# Patient Record
Sex: Female | Born: 1975 | Race: Black or African American | Hispanic: No | Marital: Married | Smoking: Never smoker
Health system: Southern US, Community
[De-identification: ages and names within clinical notes are randomized; demographics above are authoritative.]

## PROBLEM LIST (undated history)

## (undated) ENCOUNTER — Inpatient Hospital Stay (HOSPITAL_COMMUNITY): Payer: Self-pay

## (undated) DIAGNOSIS — Z789 Other specified health status: Secondary | ICD-10-CM

## (undated) DIAGNOSIS — M25539 Pain in unspecified wrist: Secondary | ICD-10-CM

## (undated) HISTORY — PX: SHOULDER ARTHROSCOPY: SHX128

## (undated) HISTORY — PX: WISDOM TOOTH EXTRACTION: SHX21

## (undated) HISTORY — PX: WRIST SURGERY: SHX841

## (undated) HISTORY — PX: KNEE ARTHROSCOPY: SHX127

---

## 1997-07-21 ENCOUNTER — Other Ambulatory Visit: Admission: RE | Admit: 1997-07-21 | Discharge: 1997-07-21 | Payer: Self-pay | Admitting: Obstetrics and Gynecology

## 1998-06-30 ENCOUNTER — Other Ambulatory Visit: Admission: RE | Admit: 1998-06-30 | Discharge: 1998-06-30 | Payer: Self-pay | Admitting: Obstetrics and Gynecology

## 1998-09-30 ENCOUNTER — Other Ambulatory Visit: Admission: RE | Admit: 1998-09-30 | Discharge: 1998-09-30 | Payer: Self-pay | Admitting: Obstetrics and Gynecology

## 1998-11-29 ENCOUNTER — Ambulatory Visit (HOSPITAL_COMMUNITY): Admission: RE | Admit: 1998-11-29 | Discharge: 1998-11-29 | Payer: Self-pay | Admitting: Family Medicine

## 1998-11-29 ENCOUNTER — Encounter: Payer: Self-pay | Admitting: Family Medicine

## 1998-12-31 ENCOUNTER — Other Ambulatory Visit: Admission: RE | Admit: 1998-12-31 | Discharge: 1998-12-31 | Payer: Self-pay | Admitting: Obstetrics and Gynecology

## 1999-03-24 ENCOUNTER — Other Ambulatory Visit: Admission: RE | Admit: 1999-03-24 | Discharge: 1999-03-24 | Payer: Self-pay | Admitting: Obstetrics and Gynecology

## 1999-07-23 ENCOUNTER — Emergency Department (HOSPITAL_COMMUNITY): Admission: EM | Admit: 1999-07-23 | Discharge: 1999-07-23 | Payer: Self-pay | Admitting: Emergency Medicine

## 1999-09-26 ENCOUNTER — Other Ambulatory Visit: Admission: RE | Admit: 1999-09-26 | Discharge: 1999-09-26 | Payer: Self-pay | Admitting: Obstetrics and Gynecology

## 2001-04-12 ENCOUNTER — Other Ambulatory Visit: Admission: RE | Admit: 2001-04-12 | Discharge: 2001-04-12 | Payer: Self-pay | Admitting: Gynecology

## 2002-05-02 ENCOUNTER — Other Ambulatory Visit: Admission: RE | Admit: 2002-05-02 | Discharge: 2002-05-02 | Payer: Self-pay | Admitting: Gynecology

## 2003-01-28 ENCOUNTER — Encounter: Payer: Self-pay | Admitting: Family Medicine

## 2003-01-28 ENCOUNTER — Encounter: Admission: RE | Admit: 2003-01-28 | Discharge: 2003-01-28 | Payer: Self-pay | Admitting: Family Medicine

## 2004-07-18 ENCOUNTER — Other Ambulatory Visit: Admission: RE | Admit: 2004-07-18 | Discharge: 2004-07-18 | Payer: Self-pay | Admitting: Family Medicine

## 2004-11-15 ENCOUNTER — Ambulatory Visit (HOSPITAL_BASED_OUTPATIENT_CLINIC_OR_DEPARTMENT_OTHER): Admission: RE | Admit: 2004-11-15 | Discharge: 2004-11-15 | Payer: Self-pay | Admitting: Orthopaedic Surgery

## 2004-11-15 ENCOUNTER — Ambulatory Visit (HOSPITAL_COMMUNITY): Admission: RE | Admit: 2004-11-15 | Discharge: 2004-11-15 | Payer: Self-pay | Admitting: Orthopaedic Surgery

## 2007-10-29 ENCOUNTER — Emergency Department (HOSPITAL_COMMUNITY): Admission: EM | Admit: 2007-10-29 | Discharge: 2007-10-29 | Payer: Self-pay | Admitting: Emergency Medicine

## 2007-11-12 ENCOUNTER — Emergency Department (HOSPITAL_COMMUNITY): Admission: EM | Admit: 2007-11-12 | Discharge: 2007-11-12 | Payer: Self-pay | Admitting: Family Medicine

## 2008-01-03 ENCOUNTER — Ambulatory Visit: Payer: Self-pay | Admitting: Internal Medicine

## 2008-01-20 ENCOUNTER — Emergency Department (HOSPITAL_COMMUNITY): Admission: EM | Admit: 2008-01-20 | Discharge: 2008-01-20 | Payer: Self-pay | Admitting: Family Medicine

## 2008-02-10 ENCOUNTER — Ambulatory Visit: Payer: Self-pay | Admitting: *Deleted

## 2008-03-23 ENCOUNTER — Ambulatory Visit: Payer: Self-pay | Admitting: Family Medicine

## 2008-03-23 ENCOUNTER — Encounter: Payer: Self-pay | Admitting: Family Medicine

## 2008-03-23 LAB — CONVERTED CEMR LAB
ALT: 9 units/L (ref 0–35)
AST: 12 units/L (ref 0–37)
Albumin: 4.2 g/dL (ref 3.5–5.2)
Alkaline Phosphatase: 32 units/L — ABNORMAL LOW (ref 39–117)
BUN: 9 mg/dL (ref 6–23)
Basophils Absolute: 0 10*3/uL (ref 0.0–0.1)
Basophils Relative: 0 % (ref 0–1)
CO2: 21 meq/L (ref 19–32)
Calcium: 8.7 mg/dL (ref 8.4–10.5)
Chloride: 103 meq/L (ref 96–112)
Cholesterol: 138 mg/dL (ref 0–200)
Creatinine, Ser: 0.71 mg/dL (ref 0.40–1.20)
Eosinophils Absolute: 0.3 10*3/uL (ref 0.0–0.7)
Eosinophils Relative: 4 % (ref 0–5)
Glucose, Bld: 93 mg/dL (ref 70–99)
HCT: 40.2 % (ref 36.0–46.0)
HDL: 58 mg/dL (ref >39)
Hemoglobin: 13 g/dL (ref 12.0–15.0)
Hgb A2 Quant: 2.4 % (ref 2.2–3.2)
Hgb A: 97.6 % (ref 96.8–97.8)
Hgb F Quant: 0 % (ref 0.0–2.0)
Hgb S Quant: 0 % (ref 0.0–0.0)
LDL Cholesterol: 57 mg/dL (ref 0–99)
Lymphocytes Relative: 46 % (ref 12–46)
Lymphs Abs: 3.5 10*3/uL (ref 0.7–4.0)
MCHC: 32.3 g/dL (ref 30.0–36.0)
MCV: 90.3 fL (ref 78.0–100.0)
Monocytes Absolute: 0.4 10*3/uL (ref 0.1–1.0)
Monocytes Relative: 5 % (ref 3–12)
Neutro Abs: 3.4 10*3/uL (ref 1.7–7.7)
Neutrophils Relative %: 45 % (ref 43–77)
Platelets: 275 10*3/uL (ref 150–400)
Potassium: 4 meq/L (ref 3.5–5.3)
RBC: 4.45 M/uL (ref 3.87–5.11)
RDW: 15.3 % (ref 11.5–15.5)
Sed Rate: 6 mm/hr (ref 0–22)
Sodium: 138 meq/L (ref 135–145)
TSH: 2.035 microintl units/mL (ref 0.350–4.50)
Total Bilirubin: 0.3 mg/dL (ref 0.3–1.2)
Total CHOL/HDL Ratio: 2.4
Total Protein: 6.8 g/dL (ref 6.0–8.3)
Triglycerides: 116 mg/dL (ref <150)
VLDL: 23 mg/dL (ref 0–40)
WBC: 7.5 10*3/uL (ref 4.0–10.5)

## 2008-03-30 ENCOUNTER — Ambulatory Visit: Payer: Self-pay | Admitting: Family Medicine

## 2008-05-15 ENCOUNTER — Encounter: Payer: Self-pay | Admitting: Family Medicine

## 2008-05-15 ENCOUNTER — Ambulatory Visit: Payer: Self-pay | Admitting: Internal Medicine

## 2008-05-15 LAB — CONVERTED CEMR LAB
Chlamydia, DNA Probe: NEGATIVE
GC Probe Amp, Genital: NEGATIVE

## 2010-05-31 ENCOUNTER — Other Ambulatory Visit: Payer: Self-pay | Admitting: Obstetrics and Gynecology
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
## 2011-10-01 ENCOUNTER — Inpatient Hospital Stay (HOSPITAL_COMMUNITY)
Admission: AD | Admit: 2011-10-01 | Discharge: 2011-10-01 | Disposition: A | Discharge status: Home / Self Care | Payer: Managed Care, Other (non HMO) | Source: Ambulatory Visit | Attending: Obstetrics & Gynecology | Admitting: Obstetrics & Gynecology

## 2011-10-01 ENCOUNTER — Encounter (HOSPITAL_COMMUNITY): Payer: Self-pay | Admitting: *Deleted

## 2011-10-01 ENCOUNTER — Inpatient Hospital Stay (HOSPITAL_COMMUNITY): Payer: Managed Care, Other (non HMO)

## 2011-10-01 DIAGNOSIS — R109 Unspecified abdominal pain: Secondary | ICD-10-CM | POA: Insufficient documentation

## 2011-10-01 DIAGNOSIS — O209 Hemorrhage in early pregnancy, unspecified: Secondary | ICD-10-CM | POA: Insufficient documentation

## 2011-10-01 HISTORY — DX: Other specified health status: Z78.9

## 2011-10-01 LAB — ABO/RH: ABO/RH(D): O POS

## 2011-10-01 MED ORDER — HYDROCODONE-ACETAMINOPHEN 5-500 MG PO TABS: 1.0000 | ORAL_TABLET | Freq: Four times a day (QID) | ORAL | Status: AC | PRN

## 2011-10-01 NOTE — ED Provider Notes (Signed)
History  Melinda Christian 36 y.o. G1P0  7+ wks   RP:  7+ wks with heavy vaginal bleeding and cramping.     Chief Complaint  Patient presents with  . Vaginal Bleeding    OB History    Grav Para Term Preterm Abortions TAB SAB Ect Mult Living   1               Past Medical History  Diagnosis Date  . No pertinent past medical history     Past Surgical History  Procedure Date  . Wisdom tooth extraction   . Knee arthroscopy   . Shoulder arthroscopy     Family History  Problem Relation Age of Onset  . Other Neg Hx     History  Substance Use Topics  . Smoking status: Never Smoker   . Smokeless tobacco: Never Used  . Alcohol Use: No    Allergies: No Known Allergies  Prescriptions prior to admission  Medication Sig Dispense Refill  . acetaminophen (TYLENOL) 500 MG tablet Take 1,000 mg by mouth every 4 (four) hours as needed. For pain      . Prenatal Vit-Fe Fumarate-FA (PRENATAL MULTIVITAMIN) TABS Take 1 tablet by mouth daily.        Physical Exam   Blood pressure 124/88, pulse 94, temperature 98.9 F (37.2 C), temperature source Oral, resp. rate 20, height 5\' 11"  (1.803 m), weight 68.856 kg (151 lb 12.8 oz), last menstrual period 08/09/2011.  Abdo soft NT Speculum dark small blood clot with cervix opened to 1 cm.  No POC identified. VE Ante uterus, non-tender.  No adnexal mass.  [place] 7+wks sIUP EP with no FHR, GS in LUS, large SCH.  ED Course  Mild vaginal dark bleeding Stable vital signs  A/P  7+ wks with vaginal bleeding, MAB per [place] with EP but no FHR and GS in lower uterine segment with large [facility].         Decision to observe for Spontaneous Ab.  Precautions associated with heavy bleeding discussed.  Will f/u with Dr Juliene Pina tomorrow am at [facility].  O+.  Genia Del MD  10/01/2011 at 3:08 pm

## 2011-10-01 NOTE — Discharge Instructions (Signed)
Incomplete Miscarriage  Miscarriages in pregnancy are common. A miscarriage is a pregnancy that has ended before the twentieth week. You have had an incomplete miscarriage. Partial parts of the fetus or placenta (afterbirth) remain behind. Sometimes further treatment is needed. The most common reason for further treatment is continued bleeding (hemorrhage). Tissue left behind may also become infected. Treatment usually is curettage. Curettage for an incomplete abortion is a procedure in which the remaining products of pregnancy are removed. This can be done by a simple sucking procedure (suction curettage). It can also be done by a simple scraping (curettage) of the inside of the uterus (womb). This may be done in the hospital or in the caregiver's office. This is only done when your caregiver knows the pregnancy has ended. This is determined by physical examination and a negative pregnancy test. It may also include an ultrasound to confirm a dead fetus. The ultrasound may also prove that products of the pregnancy remain in the uterus.  If your cervix remains dilated and you are still passing clots and tissue, your caregiver may wish to watch you for a little while. Your caregiver may want to see if you are going to finish passing all of the remaining parts of the pregnancy. If the bleeding continues, they may proceed with curettage.  WHY DO I FEEL THIS WAY  Miscarriages can be a very emotional time for prospective mothers. This is not you or your partner's fault. The miscarriage did not occur because of a lack in you or your partner. Nearly all miscarriages occur because the pregnancy has started off wrongly. At least half of miscarried pregnancies have a chromosomal abnormality (almost always not inherited). Others may have developmental problems with the fetus or placentas. Problems may not show up even when the products miscarried are studied under the microscope. You can usually begin trying for another  pregnancy as soon as your caregiver says it's okay.  HOME CARE INSTRUCTIONS    Your caregiver may order bed rest (this means only getting up to use the bathroom). Your caregiver may allow you to continue light activity. If curettage was not done at this time, but you require further treatment.   Keep track of the number of pads you use each day. Keep track of how saturated (soaked) they are. Record this information.   Do not use tampons. Do not douche or have sexual intercourse until approved by your caregiver.   It is very important to keep all follow-up appointments for re-evaluation and continuing management.   Women who have an Rh negative blood type (ie, A, B, AB, or O negative) need to receive a drug called Rh(D) immune globulin. This medicine helps protect future fetuses against problems that can occur if an Rh negative mother is carrying a baby who is Rh positive.  SEEK IMMEDIATE MEDICAL CARE IF:    You experience severe cramps in your stomach, back, or abdomen.   You run an unexplained temperature (record these).   You pass large clots or tissue (save any tissue for your caregiver to inspect).   Your bleeding increases or you become light-headed, weak, or have fainting episodes.  MAKE SURE YOU:    Understand these instructions.   Will watch your condition.   Will get help right away if you are not doing well or get worse.  Document Released: 03/27/2005 Document Revised: 03/16/2011 Document Reviewed: 11/15/2007  [employer] Patient Information 2012 [employer].

## 2011-10-01 NOTE — MAU Provider Note (Signed)
Medical Screening exam and patient care preformed by advanced practice provider.  Agree with the above management.  

## 2011-10-01 NOTE — MAU Provider Note (Signed)
36yo AAF presents with lower abd cramping and bleeding. No active bleeding at present.  Filed Vitals:   10/01/11 1212  BP: 124/88  Pulse: 94  Temp: 98.9 F (37.2 C)  Resp: 20   US reveals likely missed abortion at [redacted]w[redacted]d. Dr. Seymour Bars aware and on her way to see pt in MAU. Pt stable to await Dr. Seymour Bars arrival to complete evaluation.   Devereaux Grayson E. 2:37 PM

## 2011-10-01 NOTE — MAU Note (Signed)
Pt reports having heavy bleeding and reports passing a large mass this morning. Having pain and cramping ans well.

## 2011-10-01 NOTE — MAU Note (Signed)
Dr. Seymour Bars notified pt in MAU with c/o bleeding/passing clots, was seen in MD office Thursday. Orders for blood type and u/s. Will call MD with results.

## 2011-10-01 NOTE — MAU Note (Signed)
Pt states spotting since last Sunday, bleeding became worse Thursday, passing quarter sized clots per pt. Cramping in lower abdomen.

## 2011-11-01 ENCOUNTER — Encounter (HOSPITAL_COMMUNITY): Payer: Self-pay | Admitting: Emergency Medicine

## 2011-11-01 ENCOUNTER — Emergency Department (HOSPITAL_COMMUNITY)
Admission: EM | Admit: 2011-11-01 | Discharge: 2011-11-01 | Disposition: A | Discharge status: Home / Self Care | Payer: Managed Care, Other (non HMO) | Source: Home / Self Care | Attending: Family Medicine | Admitting: Family Medicine

## 2011-11-01 DIAGNOSIS — J069 Acute upper respiratory infection, unspecified: Secondary | ICD-10-CM

## 2011-11-01 LAB — POCT RAPID STREP A: Streptococcus, Group A Screen (Direct): NEGATIVE

## 2011-11-01 MED ORDER — PREDNISONE 20 MG PO TABS: ORAL_TABLET | ORAL | Status: AC

## 2011-11-01 MED ORDER — HYDROCODONE-ACETAMINOPHEN 7.5-325 MG/15ML PO SOLN: 15.0000 mL | Freq: Three times a day (TID) | ORAL | Status: AC | PRN

## 2011-11-01 MED ORDER — AZITHROMYCIN 250 MG PO TABS: 250.0000 mg | ORAL_TABLET | Freq: Every day | ORAL | Status: AC

## 2011-11-01 MED ORDER — FEXOFENADINE-PSEUDOEPHED ER 60-120 MG PO TB12: 1.0000 | ORAL_TABLET | Freq: Two times a day (BID) | ORAL | Status: DC

## 2011-11-01 NOTE — ED Notes (Signed)
Pt here with sinus sx that started x 4 dys ago.sx facial pressure,dry cough with chest congestion unrelieved by otc Delsym and Nyquil.denies fever,chills,n,v

## 2011-11-02 NOTE — ED Provider Notes (Signed)
History     CSN: [id number]  Arrival date & time 11/01/11  1101   First MD Initiated Contact with Patient 11/01/11 1107      Chief Complaint  Patient presents with  . Sinusitis    (Consider location/radiation/quality/duration/timing/severity/associated sxs/prior treatment) HPI Comments: 36 year old nonsmoker female with history of recurrent sinusitis and allergic rhinitis. Here complaining of nasal congestion and sinus pressure associated with nonproductive cough and sore throat for 4 days. Denies fever or chills. Appetite decreased. No nausea vomiting or diarrhea. Denies chest pain, difficulty breathing or wheezing.   Past Medical History  Diagnosis Date  . No pertinent past medical history     Past Surgical History  Procedure Date  . Wisdom tooth extraction   . Knee arthroscopy   . Shoulder arthroscopy     Family History  Problem Relation Age of Onset  . Other Neg Hx     History  Substance Use Topics  . Smoking status: Never Smoker   . Smokeless tobacco: Never Used  . Alcohol Use: Yes    OB History    Grav Para Term Preterm Abortions TAB SAB Ect Mult Living   1               Review of Systems  Constitutional: Negative for fever, chills and diaphoresis.  HENT: Positive for congestion, sore throat, rhinorrhea and sinus pressure. Negative for trouble swallowing.   Eyes: Negative for discharge and visual disturbance.  Respiratory: Positive for cough. Negative for chest tightness, shortness of breath and wheezing.   Cardiovascular: Negative for chest pain, palpitations and leg swelling.  Gastrointestinal: Negative for nausea, vomiting, abdominal pain and diarrhea.  Musculoskeletal: Negative for myalgias and arthralgias.  Skin: Negative for rash.  Neurological: Positive for headaches.    Allergies  Review of patient's allergies indicates no known allergies.  Home Medications   Current Outpatient Rx  Name Route Sig Dispense Refill  . ACETAMINOPHEN 500 MG  PO TABS Oral Take 1,000 mg by mouth every 4 (four) hours as needed. For pain    . AZITHROMYCIN 250 MG PO TABS Oral Take 1 tablet (250 mg total) by mouth daily. Take first 2 tablets together, then 1 every day until finished. Hold start taking if persistent symptoms after 48 hours. 6 tablet 0  . FEXOFENADINE-PSEUDOEPHED ER 60-120 MG PO TB12 Oral Take 1 tablet by mouth every 12 (twelve) hours. 30 tablet 0  . HYDROCODONE-ACETAMINOPHEN 7.5-325 MG/15ML PO SOLN Oral Take 15 mLs by mouth every 8 (eight) hours as needed for pain (take as instructed  for cough or pain). 120 mL 0  . PREDNISONE 20 MG PO TABS  2 tabs po daily for 3 days 6 tablet 0  . PRENATAL MULTIVITAMIN CH Oral Take 1 tablet by mouth daily.      BP 127/85  Pulse 103  Temp 98.7 F (37.1 C) (Oral)  Resp 16  SpO2 100%  LMP 08/09/2011  Breastfeeding? Unknown  Physical Exam  Nursing note and vitals reviewed. Constitutional: She is oriented to person, place, and time. She appears well-developed and well-nourished. No distress.  HENT:  Head: Normocephalic and atraumatic.  Right Ear: External ear normal.  Left Ear: External ear normal.       Nasal Congestion with erythema and swelling of nasal turbinates, clear rhinorrhea. Reported bilateral maxillary sinus tenderness. pharyngeal erythema no exudates. No uvula deviation. No trismus. TM's normal  Eyes: Conjunctivae are normal. Pupils are equal, round, and reactive to light. Right eye exhibits no discharge. Left  eye exhibits no discharge. No scleral icterus.  Neck: Normal range of motion. Neck supple. No JVD present.  Cardiovascular: Normal rate, regular rhythm and normal heart sounds.   Pulmonary/Chest: Breath sounds normal. No respiratory distress. She has no wheezes. She has no rales. She exhibits no tenderness.       Persistent dry cough during examination  Abdominal: Soft. She exhibits no distension. There is no tenderness.  Lymphadenopathy:    She has no cervical adenopathy.    Neurological: She is alert and oriented to person, place, and time.  Skin: No rash noted.    ED Course  Procedures (including critical care time)   Labs Reviewed  POCT RAPID STREP A ([facility] CARE ONLY)  LAB REPORT - SCANNED   No results found.   1. URI (upper respiratory infection)       MDM  Negative rapid strep test. Impress viral upper respiratory infection but given history of recurrent sinus infection and late onset of sore throat gave a hold prescription for azithromycin patient will fill if persistent or worsening symptoms after 48 hours. Prescribe prednisone for 3 days and treated symptomatically with fexofenadine/pseudoephedrine and hydrocodone/acetaminophen. Supportive care discussed with patient and also provided in writing. Asked to return if worsening symptoms despite following treatment.        Sharin Grave, MD 11/02/11 1034

## 2011-11-29 LAB — OB RESULTS CONSOLE ANTIBODY SCREEN: Antibody Screen: NEGATIVE

## 2011-11-29 LAB — OB RESULTS CONSOLE HIV ANTIBODY (ROUTINE TESTING): HIV: NONREACTIVE

## 2012-03-01 LAB — OB RESULTS CONSOLE GC/CHLAMYDIA
Chlamydia: NEGATIVE
Gonorrhea: NEGATIVE

## 2012-04-10 NOTE — L&D Delivery Note (Signed)
Delivery Note At 10:13 AM a viable and healthy female was delivered via Vaginal, Spontaneous Delivery (Presentation: Right Occiput Anterior).  APGAR: 9, 9; weight .  7lb 2 oz Placenta status: Intact, Spontaneous.  Cord:  CAN x 1 reducible. 3 vessels with the following complications: None.  Cord pH: none  Anesthesia: Epidural  Episiotomy: None Lacerations: 3rd degree;Perineal, vaginal sulcus Suture Repair: 3.0 chromic vicryl O- vicryl x 4, <2 cm length for repair of third degree Est. Blood Loss (mL): 350  Mom to postpartum.  Baby to nursery-stable.  Stpehanie Montroy A 08/30/2012, 10:55 AM

## 2012-07-22 LAB — OB RESULTS CONSOLE GBS: GBS: POSITIVE

## 2012-08-29 ENCOUNTER — Inpatient Hospital Stay (HOSPITAL_COMMUNITY)
Admission: AD | Admit: 2012-08-29 | Discharge: 2012-09-01 | DRG: 775 | Disposition: A | Discharge status: Home / Self Care | Payer: Managed Care, Other (non HMO) | Source: Ambulatory Visit | Attending: Obstetrics and Gynecology | Admitting: Obstetrics and Gynecology

## 2012-08-29 ENCOUNTER — Encounter (HOSPITAL_COMMUNITY): Payer: Self-pay

## 2012-08-29 DIAGNOSIS — D62 Acute posthemorrhagic anemia: Secondary | ICD-10-CM | POA: Diagnosis not present

## 2012-08-29 DIAGNOSIS — O09529 Supervision of elderly multigravida, unspecified trimester: Secondary | ICD-10-CM | POA: Diagnosis present

## 2012-08-29 DIAGNOSIS — O99892 Other specified diseases and conditions complicating childbirth: Secondary | ICD-10-CM | POA: Diagnosis present

## 2012-08-29 DIAGNOSIS — Z22330 Carrier of Group B streptococcus: Secondary | ICD-10-CM

## 2012-08-29 DIAGNOSIS — O9903 Anemia complicating the puerperium: Secondary | ICD-10-CM | POA: Diagnosis not present

## 2012-08-29 LAB — CBC
HCT: 36.9 % (ref 36.0–46.0)
MCHC: 33.6 g/dL (ref 30.0–36.0)
MCV: 90.9 fL (ref 78.0–100.0)
Platelets: 174 10*3/uL (ref 150–400)
RDW: 15.2 % (ref 11.5–15.5)
WBC: 9.6 10*3/uL (ref 4.0–10.5)

## 2012-08-29 MED ORDER — DEXTROSE 5 % IV SOLN
2.5000 10*6.[IU] | INTRAVENOUS | Status: DC
  Administered 2012-08-30 (×2): 2.5 10*6.[IU] via INTRAVENOUS
  Filled 2012-08-29 (×5): qty 2.5

## 2012-08-29 MED ORDER — ONDANSETRON HCL 4 MG/2ML IJ SOLN: 4.0000 mg | Freq: Four times a day (QID) | INTRAMUSCULAR | Status: DC | PRN

## 2012-08-29 MED ORDER — OXYTOCIN 40 UNITS IN LACTATED RINGERS INFUSION - SIMPLE MED
62.5000 mL/h | INTRAVENOUS | Status: DC
  Administered 2012-08-30: 62.5 mL/h via INTRAVENOUS

## 2012-08-29 MED ORDER — OXYTOCIN 40 UNITS IN LACTATED RINGERS INFUSION - SIMPLE MED
1.0000 m[IU]/min | INTRAVENOUS | Status: DC
  Administered 2012-08-29: 1 m[IU]/min via INTRAVENOUS
  Administered 2012-08-30: 12 m[IU]/min via INTRAVENOUS
  Filled 2012-08-29: qty 1000

## 2012-08-29 MED ORDER — PENICILLIN G POTASSIUM 5000000 UNITS IJ SOLR
5.0000 10*6.[IU] | Freq: Once | INTRAVENOUS | Status: AC
  Administered 2012-08-29: 5 10*6.[IU] via INTRAVENOUS
  Filled 2012-08-29: qty 5

## 2012-08-29 MED ORDER — NALBUPHINE HCL 10 MG/ML IJ SOLN
10.0000 mg | INTRAMUSCULAR | Status: DC | PRN
  Administered 2012-08-30: 10 mg via INTRAVENOUS
  Filled 2012-08-29 (×2): qty 1

## 2012-08-29 MED ORDER — LACTATED RINGERS IV SOLN: 500.0000 mL | INTRAVENOUS | Status: DC | PRN

## 2012-08-29 MED ORDER — LIDOCAINE HCL (PF) 1 % IJ SOLN
30.0000 mL | INTRAMUSCULAR | Status: DC | PRN
  Administered 2012-08-30: 30 mL via SUBCUTANEOUS
  Filled 2012-08-29 (×2): qty 30

## 2012-08-29 MED ORDER — ACETAMINOPHEN 325 MG PO TABS: 650.0000 mg | ORAL_TABLET | ORAL | Status: DC | PRN

## 2012-08-29 MED ORDER — OXYTOCIN BOLUS FROM INFUSION: 500.0000 mL | INTRAVENOUS | Status: DC

## 2012-08-29 MED ORDER — TERBUTALINE SULFATE 1 MG/ML IJ SOLN: 0.2500 mg | Freq: Once | INTRAMUSCULAR | Status: AC | PRN

## 2012-08-29 MED ORDER — OXYTOCIN 10 UNIT/ML IJ SOLN: 10.0000 [IU] | Freq: Once | INTRAMUSCULAR | Status: DC

## 2012-08-29 MED ORDER — CITRIC ACID-SODIUM CITRATE 334-500 MG/5ML PO SOLN: 30.0000 mL | ORAL | Status: DC | PRN

## 2012-08-29 MED ORDER — LACTATED RINGERS IV SOLN
INTRAVENOUS | Status: DC
  Administered 2012-08-30: 04:00:00 via INTRAVENOUS

## 2012-08-29 MED ORDER — OXYCODONE-ACETAMINOPHEN 5-325 MG PO TABS
1.0000 | ORAL_TABLET | ORAL | Status: DC | PRN
  Filled 2012-08-29: qty 2

## 2012-08-29 MED ORDER — IBUPROFEN 600 MG PO TABS
600.0000 mg | ORAL_TABLET | Freq: Four times a day (QID) | ORAL | Status: DC | PRN
  Administered 2012-08-30: 600 mg via ORAL
  Filled 2012-08-29: qty 1

## 2012-08-29 NOTE — H&P (Signed)
Melinda Christian is a 37 y.o. female presenting for admission due to SROM clear fluid @ 4:30PM (+) mild ctx. GBS cx (+) History OB History   Grav Para Term Preterm Abortions TAB SAB Ect Mult Living   2    1  1         Past Medical History  Diagnosis Date  . No pertinent past medical history    Past Surgical History  Procedure Laterality Date  . Wisdom tooth extraction    . Knee arthroscopy    . Shoulder arthroscopy     Family History: family history is negative for Other. Social History:  reports that she has never smoked. She has never used smokeless tobacco. She reports that  drinks alcohol. She reports that she does not use illicit drugs.   Prenatal Transfer Tool  Maternal Diabetes: No Genetic Screening: Normal Maternal Ultrasounds/Referrals: Normal Fetal Ultrasounds or other Referrals:  None Maternal Substance Abuse:  No Significant Maternal Medications:  None Significant Maternal Lab Results:  Lab values include: Group B Strep positive Other Comments:  None  ROS neg Dilation: 1.5 Effacement (%): 60 Station: -2 Exam by:: Dr. Cherly Hensen Blood pressure 136/81, pulse 83, temperature 98.1 F (36.7 C), temperature source Oral, resp. rate 16, last menstrual period 08/09/2011, unknown if currently breastfeeding. Maternal Exam:  Uterine Assessment: Contraction strength is mild.  Abdomen: Patient reports no abdominal tenderness. Estimated fetal weight is 7lb.   Fetal presentation: vertex  Introitus: Normal vulva. Ferning test: positive.  Amniotic fluid character: clear.  Pelvis: adequate for delivery.   Cervix: Cervix evaluated by digital exam.     Physical Exam  Constitutional: She is oriented to person, place, and time. She appears well-developed and well-nourished.  Neck: Neck supple.  Cardiovascular: Regular rhythm.   Respiratory: Breath sounds normal.  GI: Soft.  Neurological: She is alert and oriented to person, place, and time.  Skin: Skin is warm and dry.   Psychiatric: She has a normal mood and affect.    Prenatal labs: ABO, Rh: --/--/O POS (06/23 1253) Antibody:  neg Rubella:  Immune RPR:   NR HBsAg:   neg HIV:   NR GBS:   positive  Assessment/Plan: SROM GBS cx (+) Term gestation P) admit routine labs. Pitocin. IV PCN analgesic prn   Melinda Christian A 08/29/2012, 7:33 PM

## 2012-08-30 ENCOUNTER — Inpatient Hospital Stay (HOSPITAL_COMMUNITY): Payer: Managed Care, Other (non HMO) | Admitting: Anesthesiology

## 2012-08-30 ENCOUNTER — Encounter (HOSPITAL_COMMUNITY): Payer: Self-pay | Admitting: *Deleted

## 2012-08-30 ENCOUNTER — Encounter (HOSPITAL_COMMUNITY): Payer: Self-pay | Admitting: Anesthesiology

## 2012-08-30 MED ORDER — PHENYLEPHRINE 40 MCG/ML (10ML) SYRINGE FOR IV PUSH (FOR BLOOD PRESSURE SUPPORT)
80.0000 ug | PREFILLED_SYRINGE | INTRAVENOUS | Status: DC | PRN
  Filled 2012-08-30: qty 2

## 2012-08-30 MED ORDER — DIBUCAINE 1 % RE OINT: 1.0000 "application " | TOPICAL_OINTMENT | RECTAL | Status: DC | PRN

## 2012-08-30 MED ORDER — DIPHENHYDRAMINE HCL 25 MG PO CAPS: 25.0000 mg | ORAL_CAPSULE | Freq: Four times a day (QID) | ORAL | Status: DC | PRN

## 2012-08-30 MED ORDER — LANOLIN HYDROUS EX OINT: TOPICAL_OINTMENT | CUTANEOUS | Status: DC | PRN

## 2012-08-30 MED ORDER — FERROUS SULFATE 325 (65 FE) MG PO TABS: 325.0000 mg | ORAL_TABLET | Freq: Two times a day (BID) | ORAL | Status: DC

## 2012-08-30 MED ORDER — LACTATED RINGERS IV SOLN
500.0000 mL | Freq: Once | INTRAVENOUS | Status: AC
  Administered 2012-08-30: 500 mL via INTRAVENOUS

## 2012-08-30 MED ORDER — EPHEDRINE 5 MG/ML INJ
10.0000 mg | INTRAVENOUS | Status: DC | PRN
  Filled 2012-08-30: qty 2

## 2012-08-30 MED ORDER — IBUPROFEN 600 MG PO TABS
600.0000 mg | ORAL_TABLET | Freq: Four times a day (QID) | ORAL | Status: DC
  Administered 2012-08-30 – 2012-08-31 (×4): 600 mg via ORAL
  Filled 2012-08-30 (×4): qty 1

## 2012-08-30 MED ORDER — WITCH HAZEL-GLYCERIN EX PADS
1.0000 "application " | MEDICATED_PAD | CUTANEOUS | Status: DC | PRN
  Administered 2012-08-31: 1 via TOPICAL

## 2012-08-30 MED ORDER — OXYCODONE-ACETAMINOPHEN 5-325 MG PO TABS
1.0000 | ORAL_TABLET | ORAL | Status: DC | PRN
  Administered 2012-08-30 – 2012-08-31 (×4): 2 via ORAL
  Administered 2012-08-31: 1 via ORAL
  Administered 2012-08-31 (×2): 2 via ORAL
  Administered 2012-08-31: 1 via ORAL
  Administered 2012-09-01 (×2): 2 via ORAL
  Filled 2012-08-30 (×4): qty 2
  Filled 2012-08-30: qty 1
  Filled 2012-08-30 (×3): qty 2
  Filled 2012-08-30: qty 1

## 2012-08-30 MED ORDER — PHENYLEPHRINE 40 MCG/ML (10ML) SYRINGE FOR IV PUSH (FOR BLOOD PRESSURE SUPPORT)
80.0000 ug | PREFILLED_SYRINGE | INTRAVENOUS | Status: DC | PRN
  Filled 2012-08-30: qty 2
  Filled 2012-08-30: qty 5

## 2012-08-30 MED ORDER — EPHEDRINE 5 MG/ML INJ
10.0000 mg | INTRAVENOUS | Status: DC | PRN
  Filled 2012-08-30: qty 4
  Filled 2012-08-30: qty 2

## 2012-08-30 MED ORDER — FENTANYL 2.5 MCG/ML BUPIVACAINE 1/10 % EPIDURAL INFUSION (WH - ANES)
14.0000 mL/h | INTRAMUSCULAR | Status: DC | PRN
  Administered 2012-08-30: 14 mL/h via EPIDURAL
  Filled 2012-08-30: qty 125

## 2012-08-30 MED ORDER — ZOLPIDEM TARTRATE 5 MG PO TABS: 5.0000 mg | ORAL_TABLET | Freq: Every evening | ORAL | Status: DC | PRN

## 2012-08-30 MED ORDER — DIPHENHYDRAMINE HCL 50 MG/ML IJ SOLN: 12.5000 mg | INTRAMUSCULAR | Status: DC | PRN

## 2012-08-30 MED ORDER — ONDANSETRON HCL 4 MG PO TABS: 4.0000 mg | ORAL_TABLET | ORAL | Status: DC | PRN

## 2012-08-30 MED ORDER — SIMETHICONE 80 MG PO CHEW: 80.0000 mg | CHEWABLE_TABLET | ORAL | Status: DC | PRN

## 2012-08-30 MED ORDER — DOCUSATE SODIUM 100 MG PO CAPS: 100.0000 mg | ORAL_CAPSULE | Freq: Every day | ORAL | Status: DC

## 2012-08-30 MED ORDER — LIDOCAINE HCL (PF) 1 % IJ SOLN
INTRAMUSCULAR | Status: DC | PRN
  Administered 2012-08-30 (×2): 5 mL

## 2012-08-30 MED ORDER — BENZOCAINE-MENTHOL 20-0.5 % EX AERO
1.0000 "application " | INHALATION_SPRAY | CUTANEOUS | Status: DC | PRN
  Administered 2012-08-30 – 2012-09-01 (×2): 1 via TOPICAL
  Filled 2012-08-30 (×2): qty 56

## 2012-08-30 MED ORDER — ONDANSETRON HCL 4 MG/2ML IJ SOLN: 4.0000 mg | INTRAMUSCULAR | Status: DC | PRN

## 2012-08-30 MED ORDER — SENNOSIDES-DOCUSATE SODIUM 8.6-50 MG PO TABS
2.0000 | ORAL_TABLET | Freq: Every day | ORAL | Status: DC
  Administered 2012-08-30 – 2012-08-31 (×2): 2 via ORAL

## 2012-08-30 MED ORDER — DOCUSATE SODIUM 100 MG PO CAPS
100.0000 mg | ORAL_CAPSULE | Freq: Two times a day (BID) | ORAL | Status: DC
  Administered 2012-08-30 – 2012-09-01 (×4): 100 mg via ORAL
  Filled 2012-08-30 (×4): qty 1

## 2012-08-30 MED ORDER — PRENATAL MULTIVITAMIN CH
1.0000 | ORAL_TABLET | Freq: Every day | ORAL | Status: DC
  Administered 2012-08-31 – 2012-09-01 (×2): 1 via ORAL
  Filled 2012-08-30 (×2): qty 1

## 2012-08-30 NOTE — Anesthesia Procedure Notes (Signed)
Epidural Patient location during procedure: OB Start time: 08/30/2012 5:37 AM  Staffing Anesthesiologist: Angus Seller., Harrell Gave. Performed by: anesthesiologist   Preanesthetic Checklist Completed: patient identified, site marked, surgical consent, pre-op evaluation, timeout performed, IV checked, risks and benefits discussed and monitors and equipment checked  Epidural Patient position: sitting Prep: site prepped and draped and DuraPrep Patient monitoring: continuous pulse ox and blood pressure Approach: midline Injection technique: LOR air and LOR saline  Needle:  Needle type: Tuohy  Needle gauge: 17 G Needle length: 9 cm and 9 Needle insertion depth: 4 cm Catheter type: closed end flexible Catheter size: 19 Gauge Catheter at skin depth: 10 cm Test dose: negative  Assessment Events: blood not aspirated, injection not painful, no injection resistance, negative IV test and no paresthesia  Additional Notes Patient identified.  Risk benefits discussed including failed block, incomplete pain control, headache, nerve damage, paralysis, blood pressure changes, nausea, vomiting, reactions to medication both toxic or allergic, and postpartum back pain.  Patient expressed understanding and wished to proceed.  All questions were answered.  Sterile technique used throughout procedure and epidural site dressed with sterile barrier dressing. No paresthesia or other complications noted.The patient did not experience any signs of intravascular injection such as tinnitus or metallic taste in mouth nor signs of intrathecal spread such as rapid motor block. Please see nursing notes for vital signs.

## 2012-08-30 NOTE — Anesthesia Postprocedure Evaluation (Signed)
  Anesthesia Post-op Note  Patient: Melinda Christian  Procedure(s) Performed: * No procedures listed *  Patient Location: Mother/Baby  Anesthesia Type:Epidural  Level of Consciousness: awake  Airway and Oxygen Therapy: Patient Spontanous Breathing  Post-op Pain: none  Post-op Assessment: Patient's Cardiovascular Status Stable, Respiratory Function Stable, No signs of Nausea or vomiting, Adequate PO intake, Pain level controlled, No headache, No backache, No residual numbness and No residual motor weakness  Post-op Vital Signs: Reviewed and stable  Complications: No apparent anesthesia complications

## 2012-08-30 NOTE — Anesthesia Preprocedure Evaluation (Signed)

## 2012-08-30 NOTE — Progress Notes (Signed)
Melinda Christian is a 37 y.o. G2P0010 at [redacted]w[redacted]d by LMP admitted for rupture of membranes  Subjective: No chief complaint on file.  Epidural Pitocin  Objective: BP 103/61  Pulse 71  Temp(Src) 97.8 F (36.6 C) (Oral)  Resp 18  Ht 5\' 11"  (1.803 m)  Wt 77.111 kg (170 lb)  BMI 23.72 kg/m2  SpO2 99%  LMP 08/09/2011      FHT:  FHR: 120 bpm, variability: moderate,  accelerations:  Present,  decelerations:  Absent UC:   regular, every 2 minutes SVE:   9.5 cm dilated, 100% effaced, +! station   Labs: Lab Results  Component Value Date   WBC 9.6 08/29/2012   HGB 12.4 08/29/2012   HCT 36.9 08/29/2012   MCV 90.9 08/29/2012   PLT 174 08/29/2012    Assessment / Plan: Augmentation of labor, progressing well GBS cx (+) on IV PCN P) cont present mgmt. Labor vtx down when complete Anticipated MOD:  NSVD  Melinda Christian A 08/30/2012, 7:35 AM

## 2012-08-30 NOTE — Progress Notes (Signed)
Melinda Christian is Christian 37 y.o. G2P0010 at [redacted]w[redacted]d by LMP admitted for rupture of membranes  Subjective: No chief complaint on file. Just got epidural Pitocin C/o pain on right Objective: BP 111/63  Pulse 74  Temp(Src) 97.8 F (36.6 C) (Oral)  Resp 20  Ht 5\' 11"  (1.803 m)  Wt 77.111 kg (170 lb)  BMI 23.72 kg/m2  SpO2 99%  LMP 08/09/2011      FHT:  FHR: 120 bpm, variability: moderate,  accelerations:  Present,  decelerations:  Absent UC:   regular, every 2 minutes SVE:  6/90/0 bloody show  Labs: Lab Results  Component Value Date   WBC 9.6 08/29/2012   HGB 12.4 08/29/2012   HCT 36.9 08/29/2012   MCV 90.9 08/29/2012   PLT 174 08/29/2012    Assessment / Plan: Augmentation of labor, progressing well GBS cx (+) IV PCN P) cont present mgmt  Anticipated MOD:  NSVD  Melinda Christian 08/30/2012, 6:15 AM

## 2012-08-31 LAB — CBC
HCT: 28.7 % — ABNORMAL LOW (ref 36.0–46.0)
Hemoglobin: 9.6 g/dL — ABNORMAL LOW (ref 12.0–15.0)
MCV: 90.3 fL (ref 78.0–100.0)
RDW: 15.1 % (ref 11.5–15.5)
WBC: 14.1 10*3/uL — ABNORMAL HIGH (ref 4.0–10.5)

## 2012-08-31 MED ORDER — POLYSACCHARIDE IRON COMPLEX 150 MG PO CAPS
150.0000 mg | ORAL_CAPSULE | ORAL | Status: DC
  Administered 2012-09-01: 150 mg via ORAL
  Filled 2012-08-31: qty 1

## 2012-08-31 MED ORDER — MAGNESIUM OXIDE 400 (241.3 MG) MG PO TABS
400.0000 mg | ORAL_TABLET | Freq: Every day | ORAL | Status: DC
  Administered 2012-08-31 – 2012-09-01 (×2): 400 mg via ORAL
  Filled 2012-08-31 (×2): qty 1

## 2012-08-31 MED ORDER — HYDROCORTISONE 2.5 % RE CREA
TOPICAL_CREAM | Freq: Two times a day (BID) | RECTAL | Status: DC
  Administered 2012-08-31 – 2012-09-01 (×3): via RECTAL
  Filled 2012-08-31: qty 28.35

## 2012-08-31 MED ORDER — IBUPROFEN 800 MG PO TABS
800.0000 mg | ORAL_TABLET | Freq: Four times a day (QID) | ORAL | Status: DC
  Administered 2012-08-31 – 2012-09-01 (×4): 800 mg via ORAL
  Filled 2012-08-31 (×4): qty 1

## 2012-08-31 NOTE — Progress Notes (Signed)
PPD 1 SVD  S:  Reports feeling really sore and swollen near stitches             Tolerating po/ No nausea or vomiting             Bleeding is moderate             Pain controlled with motrin and percocet             Up ad lib / ambulatory / voiding QS  Newborn breast feeding  / Circumcision planned tomorrow O:               VS: BP 98/59  Pulse 70  Temp(Src) 98.7 F (37.1 C) (Oral)  Resp 18  Ht 5\' 11"  (1.803 m)  Wt 77.111 kg (170 lb)  BMI 23.72 kg/m2  SpO2 99%  LMP 08/09/2011   LABS:  Recent Labs  08/29/12 1940 08/31/12 0638  WBC 9.6 14.1*  HGB 12.4 9.6*  PLT 174 139*                                I&O: Intake/Output     05/23 0701 - 05/24 0700 05/24 0701 - 05/25 0700   Blood 350    Total Output 350     Net -350                      Physical Exam:             Alert and oriented X3  Lungs: Clear and unlabored  Heart: regular rate and rhythm / no mumurs  Abdomen: soft, non-tender, non-distended              Fundus: firm, non-tender, Ueven  Perineum: large labial edema / ice pack in pace  Lochia: light  Extremities: trace edema, no calf pain or tenderness  A: PPD # 1 s/p SVB with 3rd degree LAC repair             Mild ABL anemia  Doing well - stable status  P:  Routine post partum orders  Iron every other day and colace BID daily             Avoid constipation - magnesium supplement with colace - Miralax if no BM in 2 days  Marlinda Mike CNM, MSN, [facility] 08/31/2012, 12:05 PM

## 2012-09-01 MED ORDER — OXYCODONE-ACETAMINOPHEN 5-325 MG PO TABS: 1.0000 | ORAL_TABLET | ORAL | Status: DC | PRN

## 2012-09-01 MED ORDER — DOCUSATE SODIUM 100 MG PO CAPS: 200.0000 mg | ORAL_CAPSULE | Freq: Two times a day (BID) | ORAL | Status: DC

## 2012-09-01 MED ORDER — IBUPROFEN 800 MG PO TABS: 800.0000 mg | ORAL_TABLET | Freq: Four times a day (QID) | ORAL | Status: DC

## 2012-09-01 MED ORDER — MAGNESIUM OXIDE 400 (241.3 MG) MG PO TABS: 400.0000 mg | ORAL_TABLET | Freq: Every day | ORAL | Status: DC

## 2012-09-01 MED ORDER — HYDROCORTISONE 2.5 % RE CREA: TOPICAL_CREAM | Freq: Two times a day (BID) | RECTAL | Status: DC

## 2012-09-01 NOTE — Progress Notes (Signed)
PPD 2 SVD  S:  Reports feeling well- better than yestedray             Tolerating po/ No nausea or vomiting             Bleeding is light             Pain controlled with motrin and percocet             Up ad lib / ambulatory / voiding QS  Newborn breast feeding  O:               VS: BP 105/56  Pulse 72  Temp(Src) 98.1 F (36.7 C) (Oral)  Resp 17  Ht 5\' 11"  (1.803 m)  Wt 77.111 kg (170 lb)  BMI 23.72 kg/m2  SpO2 99%  LMP 08/09/2011                Physical Exam:             Alert and oriented X3  Abdomen: soft, non-tender, non-distended              Fundus: firm, non-tender, U-1  Perineum: decreasing edema  Lochia: light  Extremities: trace edema, no calf pain or tenderness   A: PPD # 2 SVD with 3rd degree   Doing well - stable status  P:  Routine post partum orders  Stool softeners - avoid constipation for 6 weeks             DC home - WOB booklet - instructions reviewed  Marlinda Mike CNM, MSN 09/01/2012, 11:33 AM

## 2012-09-01 NOTE — Discharge Summary (Signed)
Obstetric Discharge Summary  Reason for Admission: onset of labor Prenatal Procedures: none Intrapartum Procedures: spontaneous vaginal delivery and GBS prophylaxis Postpartum Procedures: none Complications-Operative and Postpartum: 3rd degree perineal laceration Hemoglobin  Date Value Range Status  08/31/2012 9.6* 12.0 - 15.0 g/dL Final     REPEATED TO VERIFY     DELTA CHECK NOTED     HCT  Date Value Range Status  08/31/2012 28.7* 36.0 - 46.0 % Final    Physical Exam:  General: alert, cooperative and no distress Lochia: appropriate Uterine Fundus: firm Incision: healing well DVT Evaluation: No evidence of DVT seen on physical exam.  Discharge Diagnoses: Term Pregnancy-delivered and thrid degree laceration repaired and IDA of pregnancy  Discharge Information: Date: 09/01/2012 Activity: pelvic rest Diet: routine Medications: PNV, Ibuprofen, Colace, Iron, Percocet and magensium and Anusol-HC external  Condition: stable Instructions: refer to practice specific booklet Discharge to: home Follow-up Information   Follow up with COUSINS,SHERONETTE A, MD. Schedule an appointment as soon as possible for a visit in 6 weeks.   Contact information:   [address] [place] [address] [phone]       Newborn Data: Live born female  Birth Weight: 7 lb 2.8 oz (3255 g) APGAR: 9, 9  Home with mother.  Melinda Christian 09/01/2012, 11:52 AM

## 2013-06-04 ENCOUNTER — Encounter (HOSPITAL_BASED_OUTPATIENT_CLINIC_OR_DEPARTMENT_OTHER): Payer: Self-pay | Admitting: *Deleted

## 2013-06-10 ENCOUNTER — Other Ambulatory Visit: Payer: Self-pay | Admitting: Orthopedic Surgery

## 2013-06-10 NOTE — H&P (Signed)
Melinda Christian is an 38 y.o. female.    Chief Complaint: Left Wrist Pain  HPI: Patient is seen  for left wrist DeQuervains stenosing tenosynovitis.  She became symptomatic and may have been treated with exercises, a couple of cortisone injections and now desires elective DeQuervains release.  Past Medical History  Diagnosis Date  . No pertinent past medical history   . Wrist pain     left    Past Surgical History  Procedure Laterality Date  . Wisdom tooth extraction    . Knee arthroscopy    . Shoulder arthroscopy      Family History  Problem Relation Age of Onset  . Other Neg Hx    Social History:  reports that she has never smoked. She has never used smokeless tobacco. She reports that she drinks alcohol. She reports that she does not use illicit drugs.  Allergies: No Known Allergies  No prescriptions prior to admission    No results found for this or any previous visit (from the past 48 hour(s)). No results found.  Review of Systems  Constitutional: Negative.   HENT: Negative.   Eyes: Negative.   Respiratory: Negative.   Cardiovascular: Negative.   Gastrointestinal: Negative.   Genitourinary: Negative.   Musculoskeletal: Positive for joint pain.  Skin: Negative.   Neurological: Negative.   Endo/Heme/Allergies: Negative.   Psychiatric/Behavioral: Negative.     Height 5\' 11"  (1.803 m), weight 63.957 kg (141 lb), last menstrual period 05/27/2013, currently breastfeeding. Physical Exam  Constitutional: She is oriented to person, place, and time. She appears well-developed and well-nourished.  HENT:  Head: Normocephalic and atraumatic.  Eyes: Pupils are equal, round, and reactive to light.  Neck: Normal range of motion. Neck supple.  Cardiovascular: Intact distal pulses.   Respiratory: Effort normal.  Musculoskeletal: She exhibits tenderness.  Positive Finkelstein's test on the left, full range of motion of thumb and fingers fairly impressive palpable nodule  over the first dorsal compartment.  She is neurovascular intact.  She has excellent grip strength and good extensor power.  Neurological: She is alert and oriented to person, place, and time.  Skin: Skin is warm and dry.  Psychiatric: She has a normal mood and affect. Her behavior is normal. Judgment and thought content normal.     Assessment/Plan  Assess: Chronic, symptomatic, left wrist DeQuervains stenosing tenosynovium  Plan: The risks and benefits of DeQuervains release were discussed with the patient.  We'll get her set up for surgical intervention at her convenience.  She had outpatient arthroscopy with Dr. Jerl Santosalldorf in the past and is a good candidate for outpatient surgery this time.  PHILLIPS, ERIC R 06/10/2013, 5:50 PM

## 2013-06-11 ENCOUNTER — Ambulatory Visit (HOSPITAL_BASED_OUTPATIENT_CLINIC_OR_DEPARTMENT_OTHER)
Admission: RE | Admit: 2013-06-11 | Discharge: 2013-06-11 | Disposition: A | Discharge status: Home / Self Care | Payer: Managed Care, Other (non HMO) | Source: Ambulatory Visit | Attending: Orthopedic Surgery | Admitting: Orthopedic Surgery

## 2013-06-11 ENCOUNTER — Encounter (HOSPITAL_BASED_OUTPATIENT_CLINIC_OR_DEPARTMENT_OTHER): Payer: Self-pay | Admitting: Anesthesiology

## 2013-06-11 ENCOUNTER — Encounter (HOSPITAL_BASED_OUTPATIENT_CLINIC_OR_DEPARTMENT_OTHER)
Admission: RE | Disposition: A | Discharge status: Home / Self Care | Payer: Self-pay | Source: Ambulatory Visit | Attending: Orthopedic Surgery

## 2013-06-11 ENCOUNTER — Ambulatory Visit (HOSPITAL_BASED_OUTPATIENT_CLINIC_OR_DEPARTMENT_OTHER): Payer: Managed Care, Other (non HMO) | Admitting: Anesthesiology

## 2013-06-11 ENCOUNTER — Encounter (HOSPITAL_BASED_OUTPATIENT_CLINIC_OR_DEPARTMENT_OTHER): Payer: Managed Care, Other (non HMO) | Admitting: Anesthesiology

## 2013-06-11 DIAGNOSIS — M654 Radial styloid tenosynovitis [de Quervain]: Secondary | ICD-10-CM | POA: Insufficient documentation

## 2013-06-11 HISTORY — PX: DORSAL COMPARTMENT RELEASE: SHX5039

## 2013-06-11 HISTORY — DX: Pain in unspecified wrist: M25.539

## 2013-06-11 SURGERY — RELEASE, FIRST DORSAL COMPARTMENT, HAND
Anesthesia: General | Site: Hand | Laterality: Left

## 2013-06-11 MED ORDER — BUPIVACAINE HCL 0.5 % IJ SOLN
INTRAMUSCULAR | Status: DC | PRN
  Administered 2013-06-11: 2 mL

## 2013-06-11 MED ORDER — CEFAZOLIN SODIUM-DEXTROSE 2-3 GM-% IV SOLR
INTRAVENOUS | Status: AC
  Filled 2013-06-11: qty 50

## 2013-06-11 MED ORDER — ONDANSETRON HCL 4 MG/2ML IJ SOLN
INTRAMUSCULAR | Status: DC | PRN
  Administered 2013-06-11: 4 mg via INTRAVENOUS

## 2013-06-11 MED ORDER — FENTANYL CITRATE 0.05 MG/ML IJ SOLN
INTRAMUSCULAR | Status: AC
  Filled 2013-06-11: qty 2

## 2013-06-11 MED ORDER — FENTANYL CITRATE 0.05 MG/ML IJ SOLN: 50.0000 ug | INTRAMUSCULAR | Status: DC | PRN

## 2013-06-11 MED ORDER — OXYCODONE HCL 5 MG PO TABS
5.0000 mg | ORAL_TABLET | Freq: Once | ORAL | Status: AC | PRN
  Administered 2013-06-11: 5 mg via ORAL

## 2013-06-11 MED ORDER — MIDAZOLAM HCL 2 MG/2ML IJ SOLN
INTRAMUSCULAR | Status: AC
  Filled 2013-06-11: qty 2

## 2013-06-11 MED ORDER — MIDAZOLAM HCL 2 MG/2ML IJ SOLN: 1.0000 mg | INTRAMUSCULAR | Status: DC | PRN

## 2013-06-11 MED ORDER — PROPOFOL 10 MG/ML IV BOLUS
INTRAVENOUS | Status: DC | PRN
  Administered 2013-06-11: 150 mg via INTRAVENOUS

## 2013-06-11 MED ORDER — DEXAMETHASONE SODIUM PHOSPHATE 4 MG/ML IJ SOLN
INTRAMUSCULAR | Status: DC | PRN
  Administered 2013-06-11: 10 mg via INTRAVENOUS

## 2013-06-11 MED ORDER — HYDROMORPHONE HCL PF 1 MG/ML IJ SOLN
INTRAMUSCULAR | Status: AC
  Filled 2013-06-11: qty 1

## 2013-06-11 MED ORDER — FENTANYL CITRATE 0.05 MG/ML IJ SOLN
INTRAMUSCULAR | Status: DC | PRN
  Administered 2013-06-11: 100 ug via INTRAVENOUS

## 2013-06-11 MED ORDER — LACTATED RINGERS IV SOLN
INTRAVENOUS | Status: DC
  Administered 2013-06-11 (×2): via INTRAVENOUS

## 2013-06-11 MED ORDER — OXYCODONE-ACETAMINOPHEN 5-325 MG PO TABS: 1.0000 | ORAL_TABLET | ORAL | Status: DC | PRN

## 2013-06-11 MED ORDER — CEFAZOLIN SODIUM-DEXTROSE 2-3 GM-% IV SOLR: 2.0000 g | INTRAVENOUS | Status: DC

## 2013-06-11 MED ORDER — HYDROMORPHONE HCL PF 1 MG/ML IJ SOLN
0.2500 mg | INTRAMUSCULAR | Status: DC | PRN
  Administered 2013-06-11: 0.25 mg via INTRAVENOUS

## 2013-06-11 MED ORDER — CHLORHEXIDINE GLUCONATE 4 % EX LIQD: 60.0000 mL | Freq: Once | CUTANEOUS | Status: DC

## 2013-06-11 MED ORDER — DEXTROSE-NACL 5-0.45 % IV SOLN: INTRAVENOUS | Status: DC

## 2013-06-11 MED ORDER — MIDAZOLAM HCL 5 MG/5ML IJ SOLN
INTRAMUSCULAR | Status: DC | PRN
  Administered 2013-06-11: 2 mg via INTRAVENOUS

## 2013-06-11 MED ORDER — OXYCODONE HCL 5 MG PO TABS
ORAL_TABLET | ORAL | Status: AC
  Filled 2013-06-11: qty 1

## 2013-06-11 MED ORDER — LIDOCAINE HCL (CARDIAC) 20 MG/ML IV SOLN
INTRAVENOUS | Status: DC | PRN
  Administered 2013-06-11: 40 mg via INTRAVENOUS

## 2013-06-11 MED ORDER — OXYCODONE HCL 5 MG/5ML PO SOLN: 5.0000 mg | Freq: Once | ORAL | Status: AC | PRN

## 2013-06-11 SURGICAL SUPPLY — 39 items
BANDAGE GAUZE STRT 1 STR LF (GAUZE/BANDAGES/DRESSINGS) IMPLANT
BLADE SURG 15 STRL LF DISP TIS (BLADE) ×1 IMPLANT
BLADE SURG 15 STRL SS (BLADE) ×3
BNDG CMPR 9X4 STRL LF SNTH (GAUZE/BANDAGES/DRESSINGS)
BNDG CMPR MD 5X2 ELC HKLP STRL (GAUZE/BANDAGES/DRESSINGS) ×1
BNDG ELASTIC 2 VLCR STRL LF (GAUZE/BANDAGES/DRESSINGS) ×3 IMPLANT
BNDG ESMARK 4X9 LF (GAUZE/BANDAGES/DRESSINGS) IMPLANT
CORDS BIPOLAR (ELECTRODE) ×3 IMPLANT
COVER MAYO STAND STRL (DRAPES) ×3 IMPLANT
COVER TABLE BACK 60X90 (DRAPES) ×3 IMPLANT
DECANTER SPIKE VIAL GLASS SM (MISCELLANEOUS) IMPLANT
DRAPE EXTREMITY T 121X128X90 (DRAPE) ×3 IMPLANT
DURAPREP 26ML APPLICATOR (WOUND CARE) ×3 IMPLANT
GAUZE XEROFORM 1X8 LF (GAUZE/BANDAGES/DRESSINGS) ×3 IMPLANT
GLOVE BIO SURGEON STRL SZ7.5 (GLOVE) ×1 IMPLANT
GLOVE BIO SURGEON STRL SZ8.5 (GLOVE) ×3 IMPLANT
GLOVE BIOGEL PI IND STRL 8 (GLOVE) ×1 IMPLANT
GLOVE BIOGEL PI IND STRL 9 (GLOVE) ×1 IMPLANT
GLOVE BIOGEL PI INDICATOR 8 (GLOVE) ×2
GLOVE BIOGEL PI INDICATOR 9 (GLOVE) ×2
GLOVE ECLIPSE 6.5 STRL STRAW (GLOVE) ×2 IMPLANT
GLOVE INDICATOR 7.0 STRL GRN (GLOVE) ×3 IMPLANT
GOWN STRL REUS W/ TWL XL LVL3 (GOWN DISPOSABLE) ×1 IMPLANT
GOWN STRL REUS W/TWL XL LVL3 (GOWN DISPOSABLE) ×8 IMPLANT
NEEDLE HYPO 22GX1.5 SAFETY (NEEDLE) ×2 IMPLANT
NS IRRIG 1000ML POUR BTL (IV SOLUTION) ×3 IMPLANT
PACK BASIN DAY SURGERY FS (CUSTOM PROCEDURE TRAY) ×3 IMPLANT
PADDING CAST ABS 4INX4YD NS (CAST SUPPLIES) ×2
PADDING CAST ABS COTTON 4X4 ST (CAST SUPPLIES) ×1 IMPLANT
PADDING UNDERCAST 2  STERILE (CAST SUPPLIES) IMPLANT
SPLINT UNIVERSAL RIGHT (SOFTGOODS) IMPLANT
SPLINT WRIST 10 LEFT UNV (SOFTGOODS) IMPLANT
SPONGE GAUZE 4X4 12PLY STER LF (GAUZE/BANDAGES/DRESSINGS) ×2 IMPLANT
SUT ETHILON 5 0 PS 2 18 (SUTURE) IMPLANT
SUT VICRYL 4-0 PS2 18IN ABS (SUTURE) ×2 IMPLANT
SYR BULB 3OZ (MISCELLANEOUS) ×3 IMPLANT
SYR CONTROL 10ML LL (SYRINGE) ×2 IMPLANT
TOWEL OR 17X24 6PK STRL BLUE (TOWEL DISPOSABLE) ×3 IMPLANT
UNDERPAD 30X30 INCONTINENT (UNDERPADS AND DIAPERS) ×3 IMPLANT

## 2013-06-11 NOTE — Anesthesia Preprocedure Evaluation (Signed)
Anesthesia Evaluation  Patient identified by MRN, date of birth, ID band Patient awake    Reviewed: Allergy & Precautions, H&P , NPO status , Patient's Chart, lab work & pertinent test results  Airway Mallampati: I TM Distance: >3 FB Neck ROM: Full    Dental no notable dental hx. (+) Teeth Intact, Dental Advisory Given   Pulmonary neg pulmonary ROS,  breath sounds clear to auscultation  Pulmonary exam normal       Cardiovascular negative cardio ROS  Rhythm:Regular Rate:Normal     Neuro/Psych negative neurological ROS  negative psych ROS   GI/Hepatic negative GI ROS, Neg liver ROS,   Endo/Other  negative endocrine ROS  Renal/GU negative Renal ROS  negative genitourinary   Musculoskeletal   Abdominal   Peds  Hematology negative hematology ROS (+)   Anesthesia Other Findings   Reproductive/Obstetrics negative OB ROS                           Anesthesia Physical Anesthesia Plan  ASA: I  Anesthesia Plan: General   Post-op Pain Management:    Induction: Intravenous  Airway Management Planned: LMA  Additional Equipment:   Intra-op Plan:   Post-operative Plan: Extubation in OR  Informed Consent: I have reviewed the patients History and Physical, chart, labs and discussed the procedure including the risks, benefits and alternatives for the proposed anesthesia with the patient or authorized representative who has indicated his/her understanding and acceptance.   Dental advisory given  Plan Discussed with: CRNA  Anesthesia Plan Comments:         Anesthesia Quick Evaluation  

## 2013-06-11 NOTE — Transfer of Care (Signed)
Immediate Anesthesia Transfer of Care Note  Patient: Melinda Christian  Procedure(s) Performed: Procedure(s): LEFT RELEASE DORSAL COMPARTMENT (DEQUERVAIN) (Left)  Patient Location: PACU  Anesthesia Type:General  Level of Consciousness: sedated and patient cooperative  Airway & Oxygen Therapy: Patient Spontanous Breathing and Patient connected to face mask oxygen  Post-op Assessment: Report given to PACU RN and Post -op Vital signs reviewed and stable  Post vital signs: Reviewed and stable  Complications: No apparent anesthesia complications

## 2013-06-11 NOTE — Interval H&P Note (Signed)
History and Physical Interval Note:  06/11/2013 11:39 AM  Melinda Christian  has presented today for surgery, with the diagnosis of LEFT DE'QUERVAIN  The various methods of treatment have been discussed with the patient and family. After consideration of risks, benefits and other options for treatment, the patient has consented to  Procedure(s): LEFT RELEASE DORSAL COMPARTMENT (DEQUERVAIN) (Left) as a surgical intervention .  The patient's history has been reviewed, patient examined, no change in status, stable for surgery.  I have reviewed the patient's chart and labs.  Questions were answered to the patient's satisfaction.     Nestor LewandowskyOWAN,Braxton Vantrease J

## 2013-06-11 NOTE — Op Note (Signed)
Pre Op Dx: DeQuervains stenosing Tenosynovitis left  Post Op Dx: Same  Procedure: Left first dorsal compartment release of the wrist  Surgeon: Nestor LewandowskyFrank J Lujain Kraszewski, MD  Assistant: Tomi LikensEric K. Gaylene BrooksPhillips PA-C  (present throughout entire procedure and necessary for timely completion of the procedure)  Anesthesia: General  EBL: Minimal  Fluids: 800 cc crystalloid  Tourniquet Time: None  Indications: Patient has had inflammation of the first dorsal compartment pulley on the left side for many months and is failed conservative treatment with bracing cortisone injections and observation. The pain bothers her great deal when she tries to pinch or grasp, and she has a fairly impressive swelling or nodule over the first dorsal compartment pulley. Risks and benefits of release have been discussed and she is prepared for surgical intervention.  Procedure: Patient identified by arm band and taken to operating [facility] 8 at the [facility] where she underwent general LMA anesthesia. Tourniquet was applied to the left upper extremity but never used. Left upper Greer PickerelCharney was prepped and draped in usual sterile fashion from the fingertips to the elbow timeout procedure was performed. We began the operation by making a 1/2 cm incision over the first dorsal compartment pulley to the skin and there was essentially no subcutaneous tissue. We then excised the lateral half of the pulley and examined the tendons of the abductor pollicis longus and extensor pollicis brevis. Satisfied with the decompression release the wound is irrigated out normal saline solution and the skin only closed with running 4-0 Vicryl suture. A dressing of Xerofoam 4 x 4 dressing sponges and attention Ace wrap was applied the patient was then awakened and taken to the recovery without difficulty

## 2013-06-11 NOTE — Anesthesia Postprocedure Evaluation (Signed)
  Anesthesia Post-op Note  Patient: Melinda ChestnutCarolyn Y Christian  Procedure(s) Performed: Procedure(s): LEFT RELEASE DORSAL COMPARTMENT (DEQUERVAIN) (Left)  Patient Location: PACU  Anesthesia Type:General  Level of Consciousness: awake and alert   Airway and Oxygen Therapy: Patient Spontanous Breathing  Post-op Pain: mild  Post-op Assessment: Post-op Vital signs reviewed, Patient's Cardiovascular Status Stable and Respiratory Function Stable  Post-op Vital Signs: Reviewed  Filed Vitals:   06/11/13 1245  BP: 122/73  Pulse: 71  Temp:   Resp: 13    Complications: No apparent anesthesia complications

## 2013-06-11 NOTE — Discharge Instructions (Addendum)
De Quervain's Tenosynovitis Surgical Release, Care After Refer to this sheet in the next few weeks. These instructions provide you with information on caring for yourself after your procedure. Your caregiver may also give you specific instructions. Your treatment has been planned according to current medical practices, but problems sometimes occur. Call your caregiver if you have any problems or questions after your procedure. HOME CARE INSTRUCTIONS   Take any medication that your surgeon prescribed. Follow the directions carefully.  Only take over-the-counter or prescription medicines for pain, discomfort or fever as directed by your caregiver. Do not take aspirin unless directed by your surgeon. Aspirin increases the chances of bleeding.  For the first few days after your surgery, keep your arm above your heart as much as possible. This can help prevent swelling.  Do not get the incision wet for several days (or as directed by your surgeon).  Follow your surgeon's directions for changing the dressing over the incision.  Wear the splint as directed by your surgeon.  You may be given simple exercises to do once the splint can be removed during the day. These will keep the wrist from getting stiff and keep it from swelling. The exercises also will help bring back normal movement. When you can move the wrist and thumb normally, you will start doing exercises to make them stronger. You might do these on your own or you might work with a physical or occupational therapist.  Bonita QuinYou may need more treatment to help the wrist heal. This could include:  Massage.  Stimulation of the nerves with electrical waves.  Do not rush to use your hand before allowed by your surgeon. Using your wrist and thumb before they are healed can cause a delay in recovery. SEEK MEDICAL CARE IF:   Your incision area becomes red, swollen, or feels warm to the touch.  You have blood or pus coming from the incision.  Your  incision smells bad. This could mean it is infected.  Your pain in your wrist or hand increases or it does not get better with pain medicine. SEEK IMMEDIATE MEDICAL CARE IF:  You have a fever.   Post Anesthesia Home Care Instructions  Activity: Get plenty of rest for the remainder of the day. A responsible adult should stay with you for 24 hours following the procedure.  For the next 24 hours, DO NOT: -Drive a car -Advertising copywriterperate machinery -Drink alcoholic beverages -Take any medication unless instructed by your physician -Make any legal decisions or sign important papers.  Meals: Start with liquid foods such as gelatin or soup. Progress to regular foods as tolerated. Avoid greasy, spicy, heavy foods. If nausea and/or vomiting occur, drink only clear liquids until the nausea and/or vomiting subsides. Call your physician if vomiting continues.  Special Instructions/Symptoms: Your throat may feel dry or sore from the anesthesia or the breathing tube placed in your throat during surgery. If this causes discomfort, gargle with warm salt water. The discomfort should disappear within 24 hours.

## 2013-06-11 NOTE — Anesthesia Procedure Notes (Signed)
Procedure Name: LMA Insertion Date/Time: 06/11/2013 11:48 AM Performed by: Genevieve NorlanderLINKA, Melinda Christian Pre-anesthesia Checklist: Patient identified, Emergency Drugs available, Suction available, Patient being monitored and Timeout performed Patient Re-evaluated:Patient Re-evaluated prior to inductionOxygen Delivery Method: Circle System Utilized Preoxygenation: Pre-oxygenation with 100% oxygen Intubation Type: IV induction Ventilation: Mask ventilation without difficulty LMA: LMA inserted LMA Size: 3.0 Number of attempts: 1 Airway Equipment and Method: bite block Placement Confirmation: positive ETCO2 and breath sounds checked- equal and bilateral Tube secured with: Tape Dental Injury: Teeth and Oropharynx as per pre-operative assessment

## 2013-06-12 ENCOUNTER — Encounter (HOSPITAL_BASED_OUTPATIENT_CLINIC_OR_DEPARTMENT_OTHER): Payer: Self-pay | Admitting: Orthopedic Surgery

## 2013-06-12 LAB — POCT HEMOGLOBIN-HEMACUE: HEMOGLOBIN: 12.9 g/dL (ref 12.0–15.0)

## 2013-10-09 ENCOUNTER — Encounter (HOSPITAL_COMMUNITY): Payer: Self-pay

## 2013-10-09 ENCOUNTER — Inpatient Hospital Stay (HOSPITAL_COMMUNITY)
Admission: AD | Admit: 2013-10-09 | Discharge: 2013-10-09 | Disposition: A | Discharge status: Home / Self Care | Payer: Managed Care, Other (non HMO) | Source: Ambulatory Visit | Attending: Obstetrics and Gynecology | Admitting: Obstetrics and Gynecology

## 2013-10-09 DIAGNOSIS — O200 Threatened abortion: Secondary | ICD-10-CM | POA: Insufficient documentation

## 2013-10-09 HISTORY — DX: Other specified health status: Z78.9

## 2013-10-09 LAB — POCT PREGNANCY, URINE: PREG TEST UR: POSITIVE — AB

## 2013-10-09 LAB — HCG, QUANTITATIVE, PREGNANCY: hCG, Beta Chain, Quant, S: 1099 m[IU]/mL — ABNORMAL HIGH (ref <5)

## 2013-10-09 NOTE — MAU Provider Note (Signed)
History     CSN: [id number]  Arrival date and time: 10/09/13 0139 Nurse call to provider @ [phone] - orders given TC back from nurse @ 0345 - orders delayed due to lab equipment                                                 - will be after 5am for Quant to be ran   First Provider Initiated Contact with Patient 10/09/13 0433      Chief Complaint  Patient presents with  . Possible Pregnancy  . Vaginal Bleeding  . Abdominal Pain   HPI  Bleeding since Tuesday Increased bleeding tonight with intense cramps - passed small blood clots States pain and bleeding better since arrival to hospital Taking ibuprofen since cramps started - took percocet before travel to hospital tonight  Past Medical History  Diagnosis Date  . No pertinent past medical history   . Wrist pain     left  . Medical history non-contributory    SVD with 3rd degree laceration/repair - [facility] Juliene PinaMody  Past Surgical History  Procedure Laterality Date  . Wisdom tooth extraction    . Knee arthroscopy    . Shoulder arthroscopy    . Dorsal compartment release Left 06/11/2013    Procedure: LEFT RELEASE DORSAL COMPARTMENT (DEQUERVAIN);  Surgeon: Nestor LewandowskyFrank J Rowan, MD;  Location: [facility];  Service: Orthopedics;  Laterality: Left;    Family History  Problem Relation Age of Onset  . Other Neg Hx     History  Substance Use Topics  . Smoking status: Never Smoker   . Smokeless tobacco: Never Used  . Alcohol Use: Yes     Comment: social    Allergies: No Known Allergies  Prescriptions prior to admission  Medication Sig Dispense Refill  . ibuprofen (ADVIL,MOTRIN) 200 MG tablet Take 600 mg by mouth every 6 (six) hours as needed for cramping.      Marland Kitchen. oxyCODONE-acetaminophen (PERCOCET) 10-325 MG per tablet Take 1 tablet by mouth every 4 (four) hours as needed for pain.      . Prenatal Vit-Fe Fumarate-FA (PRENATAL MULTIVITAMIN) TABS tablet Take 1 tablet by mouth daily at 12  noon.        ROS No nausea or vomiting Abdominal cramping  Intense cramping pain earlier - resolved now Vaginal bleeding bright red since Tuesday light - tonight very heavy with small clots  Physical Exam   Blood pressure 108/63, pulse 74, temperature 98.9 F (37.2 C), temperature source Oral, resp. rate 20, last menstrual period 08/17/2013, SpO2 100.00%, currently breastfeeding.  Physical Exam Alert and oriented NAD - sleeping at intervals in past 1/2 hour Abdomen soft and non-tender Vaginal-external: bright red blood small amount on peripad Spec Exam: small bright red blood in vaginal vault Cervix appears closed / no active bleeding visualized Bimanual exam: bladder non-tender / uterus retroverted without appreciable enlargement with mild tenderness / negative CMT /adnexa no masses or tenderness on exam  Blood type and RH on file : O positive  MAU Course  Procedures  Assessment and Plan  [redacted] weeks pregnant with vaginal bleeding x 2 days Threatened Abortion  Discussed with patient and support person - possible miscarriage Blood test (quant) pending due to lab delay - will be additional length of time until available Option  offered to wait for test result or go home with office follow-up - prefers home at this time  D/c home - instructions reviewed / signs to call Call office # with any further concerns or symptoms - provider on-call for her 24/7  Marlinda MikeBAILEY, Jaia Alonge 10/09/2013, 4:37 AM

## 2013-10-09 NOTE — Discharge Instructions (Signed)
Threatened Miscarriage A threatened miscarriage occurs when you have vaginal bleeding during your first 20 weeks of pregnancy but the pregnancy has not ended. If you have vaginal bleeding during this time, your health care provider will do tests to make sure you are still pregnant. If the tests show you are still pregnant and the developing baby (fetus) inside your womb (uterus) is still growing, your condition is considered a threatened miscarriage. A threatened miscarriage does not mean your pregnancy will end, but it does increase the risk of losing your pregnancy. CAUSES  The cause of a threatened miscarriage is usually not known. If you go on to have a complete miscarriage, the most common cause is an abnormal number of chromosomes in the developing baby. Chromosomes are the structures inside cells that hold all your genetic material. Some causes of vaginal bleeding that do not result in miscarriage include:  Having sex.  Having an infection.  Normal hormone changes of pregnancy.  Bleeding that occurs when an egg implants in your uterus.  SIGNS AND SYMPTOMS  Light vaginal bleeding.  Mild abdominal pain or cramps. DIAGNOSIS  If you have bleeding with or without abdominal pain before 20 weeks of pregnancy, your health care provider will do tests to check whether you are still pregnant. One important test involves using sound waves and a computer (ultrasound) to create images of the inside of your uterus. Other tests include an internal exam of your vagina and uterus (pelvic exam) and measurement of your baby's heart rate.  TREATMENT  No treatments have been shown to prevent a threatened miscarriage from going on to a complete miscarriage. However, the right home care is important.  HOME CARE INSTRUCTIONS   Make sure you keep all your appointments for prenatal care. This is very important.  Get plenty of rest.  Do not have sex or use tampons if you have vaginal bleeding.  Do not  douche.  Do not smoke or use recreational drugs.  Do not drink alcohol.  Avoid caffeine.  SEEK IMMEDIATE MEDICAL CARE IF:  You have heavy vaginal bleeding.  You have blood clots coming from your vagina.  You have severe low back pain or abdominal cramps.  You have fever, chills, and severe abdominal pain. MAKE SURE YOU:  Understand these instructions.  Will watch your condition.  Will get help right away if you are not doing well or get worse.   Document Released: 03/27/2005 Document Revised: 04/01/2013 Document Reviewed: 01/21/2013 [facility] Patient Information 2015 [employer] [place]. This information is not intended to replace advice given to you by your health care provider. Make sure you discuss any questions you have with your health care provider.

## 2013-10-09 NOTE — MAU Note (Signed)
Vaginal bleeding since Tuesday; started out light brown spotting; bleeding has increased. Now bleeding is red & heavier; passing clots. Saturated 1 pad in a 2 hour period. Lower abdominal pain started 11 pm tonight.

## 2013-10-11 ENCOUNTER — Inpatient Hospital Stay (HOSPITAL_COMMUNITY): Payer: Managed Care, Other (non HMO)

## 2013-10-11 ENCOUNTER — Inpatient Hospital Stay (HOSPITAL_COMMUNITY)
Admission: AD | Admit: 2013-10-11 | Discharge: 2013-10-11 | Disposition: A | Discharge status: Home / Self Care | Payer: Managed Care, Other (non HMO) | Source: Ambulatory Visit | Attending: Obstetrics & Gynecology | Admitting: Obstetrics & Gynecology

## 2013-10-11 ENCOUNTER — Encounter (HOSPITAL_COMMUNITY): Payer: Self-pay | Admitting: *Deleted

## 2013-10-11 DIAGNOSIS — O039 Complete or unspecified spontaneous abortion without complication: Secondary | ICD-10-CM | POA: Insufficient documentation

## 2013-10-11 LAB — CBC
HCT: 37.7 % (ref 36.0–46.0)
HEMOGLOBIN: 12.7 g/dL (ref 12.0–15.0)
MCH: 30.1 pg (ref 26.0–34.0)
MCHC: 33.7 g/dL (ref 30.0–36.0)
MCV: 89.3 fL (ref 78.0–100.0)
PLATELETS: 268 10*3/uL (ref 150–400)
RBC: 4.22 MIL/uL (ref 3.87–5.11)
RDW: 14.3 % (ref 11.5–15.5)
WBC: 6.7 10*3/uL (ref 4.0–10.5)

## 2013-10-11 LAB — HCG, QUANTITATIVE, PREGNANCY: hCG, Beta Chain, Quant, S: 124 m[IU]/mL — ABNORMAL HIGH (ref <5)

## 2013-10-11 MED ORDER — OXYCODONE-ACETAMINOPHEN 5-325 MG PO TABS
2.0000 | ORAL_TABLET | Freq: Once | ORAL | Status: AC
  Administered 2013-10-11: 2 via ORAL
  Filled 2013-10-11: qty 2

## 2013-10-11 MED ORDER — METHYLERGONOVINE MALEATE 0.2 MG PO TABS
0.2000 mg | ORAL_TABLET | Freq: Once | ORAL | Status: AC
  Administered 2013-10-11: 0.2 mg via ORAL
  Filled 2013-10-11: qty 1

## 2013-10-11 NOTE — Discharge Instructions (Signed)
Miscarriage A miscarriage is the loss of an unborn baby (fetus) before the 20th week of pregnancy. The cause is often unknown.  HOME CARE  You may need to stay in bed (bed rest), or you may be able to do light activity. Go about activity as told by your doctor.  Have help at home.  Write down how many pads you use each day. Write down how soaked they are.  Do not use tampons. Do not wash out your vagina (douche) or have sex (intercourse) until your doctor approves.  Only take medicine as told by your doctor.  Do not take aspirin.  Keep all doctor visits as told.  If you or your partner have problems with grieving, talk to your doctor. You can also try counseling. Give yourself time to grieve before trying to get pregnant again. GET HELP RIGHT AWAY IF:  You have bad cramps or pain in your back or belly (abdomen).  You have a fever.  You pass large clumps of blood (clots) from your vagina that are walnut-sized or larger. Save the clumps for your doctor to see.  You pass large amounts of tissue from your vagina. Save the tissue for your doctor to see.  You have more bleeding.  You have thick, bad-smelling fluid (discharge) coming from the vagina.  You get lightheaded, weak, or you pass out (faint).  You have chills. MAKE SURE YOU:  Understand these instructions.  Will watch your condition.  Will get help right away if you are not doing well or get worse. Document Released: 06/19/2011 Document Reviewed: 06/19/2011 [facility] Patient Information 2015 [place], [place]. This information is not intended to replace advice given to you by your health care provider. Make sure you discuss any questions you have with your health care provider. Human Chorionic Gonadotropin (hCG) This is a test to confirm and monitor pregnancy or to diagnose trophoblastic disease or germ cell tumors. As early as 10 days after a missed menstrual period (some methods can detect hCG even earlier, at one  week after conception) or if your caregiver thinks that your symptoms suggest ectopic pregnancy, a failing pregnancy, trophoblastic disease, or germ cell tumors. hCG is a protein produced in the placenta of a pregnant woman. A pregnancy test is a specific blood or urine test that can detect hCG and confirm pregnancy. This hormone is able to be detected 10 days after a missed menstrual period, the time period when the fertilized egg is implanted in the woman's uterus. With some methods, hCG can be detected even earlier, at one week after conception.  During the early weeks of pregnancy, hCG is important in maintaining function of the corpus luteum (the mass of cells that forms from a mature egg). Production of hCG increases steadily during the first trimester (8-10 weeks), peaking around the 10th week after the last menstrual cycle. Levels then fall slowly during the remainder of the pregnancy. hCG is no longer detectable within a few weeks after delivery. hCG is also produced by some germ cell tumors and increased levels are seen in trophoblastic disease. SAMPLE COLLECTION hCG is commonly detected in urine. The preferred specimen is a random urine sample collected first thing in the morning. hCG can also be measured in blood drawn from a vein in the arm. NORMAL FINDINGS Qualitative:negative in non-pregnant women; positive in pregnancy Quantitative:   Gestation less than 1 week: 5-50 Whole HCG (milli-international units/mL)  Gestation of 2 weeks: 50-500 Whole HCG (milli-international units/mL)  Gestation of 3  weeks: 100-10,000 Whole HCG (milli-international units/mL)  Gestation of 4 weeks: 1,000-30,000 Whole HCG (milli-international units/mL)  Gestation of 5 weeks 3,500-115,000 Whole HCG (milli-international units/mL)  Gestation of 6-8 weeks: 12,000-270,000 Whole HCG (milli-international units/mL)  Gestation of 12 weeks: 15,000-220,000 Whole HCG (milli-international units/mL)  Males and  non-pregnant females: less than 5 Whole HCG (milli-international units/mL) Beta subunit: depends on the method and test used Ranges for normal findings may vary among different laboratories and hospitals. You should always check with your doctor after having lab work or other tests done to discuss the meaning of your test results and whether your values are considered within normal limits. MEANING OF TEST  Your caregiver will go over the test results with you and discuss the importance and meaning of your results, as well as treatment options and the need for additional tests if necessary. OBTAINING THE TEST RESULTS It is your responsibility to obtain your test results. Ask the lab or department performing the test when and how you will get your results. Document Released: 04/28/2004 Document Revised: 06/19/2011 Document Reviewed: 03/07/2008 [facility] Patient Information 2015 [place], [place]LL[place]. This information is not intended to replace advice given to you by your health care provider. Make sure you discuss any questions you have with your health care provider.

## 2013-10-11 NOTE — MAU Provider Note (Addendum)
History     CSN: [id number]  Arrival date and time: 10/11/13 1339 Provider notified & verbal orders given: 1414 Provider on unit: 1530 Provider at bedside: 1543     Chief Complaint  Patient presents with  . Vaginal Bleeding   HPI  [name] is a 38 yo G3P1011 female at 7.[redacted] wks gestation presenting with complaints of increased bleeding and pain.  She had a HCG of 1,099 on 7/2.  Past Medical History  Diagnosis Date  . No pertinent past medical history   . Wrist pain     left  . Medical history non-contributory   [facility]; Primary OB Provider: Dr. Juliene Christian   Past Surgical History  Procedure Laterality Date  . Wisdom tooth extraction    . Knee arthroscopy    . Shoulder arthroscopy    . Dorsal compartment release Left 06/11/2013    Procedure: LEFT RELEASE DORSAL COMPARTMENT (DEQUERVAIN);  Surgeon: Melinda LewandowskyFrank J Rowan, MD;  Location: [facility];  Service: Orthopedics;  Laterality: Left;    Family History  Problem Relation Age of Onset  . Other Neg Hx     History  Substance Use Topics  . Smoking status: Never Smoker   . Smokeless tobacco: Never Used  . Alcohol Use: Yes     Comment: social    Allergies: No Known Allergies  Prescriptions prior to admission  Medication Sig Dispense Refill  . FENUGREEK PO Take 3 each by mouth 4 (four) times daily.      Marland Kitchen. ibuprofen (ADVIL,MOTRIN) 200 MG tablet Take 600 mg by mouth every 6 (six) hours as needed for cramping.      Marland Kitchen. oxyCODONE-acetaminophen (PERCOCET/ROXICET) 5-325 MG per tablet Take 1 tablet by mouth every 6 (six) hours as needed for severe pain.      . Prenatal Vit-Fe Fumarate-FA (PRENATAL MULTIVITAMIN) TABS tablet Take 1 tablet by mouth daily.         Review of Systems  Constitutional: Negative.   HENT: Negative.   Eyes: Negative.   Respiratory: Negative.   Cardiovascular: Negative.   Gastrointestinal: Negative.   Genitourinary:       (+)  pelvic pain; bright, red vaginal bleeding since Tuesday - increased today  Musculoskeletal: Negative.   Skin: Negative.   Neurological: Negative.   Endo/Heme/Allergies: Negative.   Psychiatric/Behavioral: Negative.    Results for orders placed during the hospital encounter of 10/11/13 (from the past 24 hour(s))  CBC     Status: None   Collection Time    10/11/13  2:40 PM      Result Value Ref Range   WBC 6.7  4.0 - 10.5 K/uL   RBC 4.22  3.87 - 5.11 MIL/uL   Hemoglobin 12.7  12.0 - 15.0 g/dL   HCT 82.937.7  56.236.0 - 13.046.0 %   MCV 89.3  78.0 - 100.0 fL   MCH 30.1  26.0 - 34.0 pg   MCHC 33.7  30.0 - 36.0 g/dL   RDW 86.514.3  78.411.5 - 69.615.5 %   Platelets 268  150 - 400 K/uL  HCG, QUANTITATIVE, PREGNANCY     Status: Abnormal   Collection Time    10/11/13  2:40 PM      Result Value Ref Range   hCG, Beta Chain, Quant, S 124 (*) <5 mIU/mL   OB Complete U/S <14wks CLINICAL DATA: Current spontaneous abortion, continued bleeding evaluate for retained products of conception  EXAM: TRANSABDOMINAL AND  TRANSVAGINAL ULTRASOUND OF PELVIS  TECHNIQUE: Both transabdominal and transvaginal ultrasound examinations of the pelvis were performed. Transabdominal technique was performed for global imaging of the pelvis including uterus, ovaries, adnexal regions, and pelvic cul-de-sac. It was necessary to proceed with endovaginal exam following the transabdominal exam to visualize the endometrium.  COMPARISON: None  FINDINGS: Uterus  Measurements: 8.5 x 4.7 x 5.4 cm. No fibroids or other mass visualized.  Endometrium  Thickness: 4 mm. No focal abnormality visualized. No associated vascularity/color Doppler flow.  Right ovary  Measurements: 3.7 x 1.5 x 2.5 cm. Normal appearance/no adnexal mass.  Left ovary  Measurements: 2.7 x 1.3 x 1.2 cm. Normal appearance/no adnexal mass.  Other findings  No free fluid.  IMPRESSION: Negative pelvic ultrasound.  Specifically, no findings suspicious for  retained products of conception.   Electronically Signed By: Melinda Christian M.D. On: 10/11/2013 16:57    Physical Exam   Blood pressure 117/60, pulse 82, resp. rate 18, last menstrual period 08/17/2013, currently breastfeeding.  Physical Exam  Constitutional: She is oriented to person, place, and time. She appears well-developed and well-nourished.  HENT:  Head: Normocephalic.  Eyes: Pupils are equal, round, and reactive to light.  Neck: Normal range of motion. Neck supple.  Cardiovascular: Normal rate, regular rhythm and normal heart sounds.   Respiratory: Effort normal and breath sounds normal.  GI: Soft. Bowel sounds are normal.  Genitourinary: Vagina normal and uterus normal.  Soft, enlarged uterus, nontender; SSE: small amount of mucous coming from cervical os; VE: FT/long/soft  Musculoskeletal: Normal range of motion.  Neurological: She is alert and oriented to person, place, and time. She has normal reflexes.  Skin: Skin is warm and dry.  Psychiatric: She has a normal mood and affect. Her behavior is normal. Judgment and thought content normal.  Very tearful    MAU Course  Procedures CBC HCG OB Complete US <14wks Methergine 0.2 mg po Assessment and Plan  38 yo G3P1011 @ 7.[redacted] wks gestation Miscarriage  Discharge Home Bleeding Precautions and Miscarriage instructions reviewed Repeat HCG Monday 10/13/2013  *Dr. Juliene Christian notified of assessment and plan - agrees  Kenard GowerAWSON, Caeli Linehan, M MSN, CNM 10/11/2013, 4:03 PM

## 2013-10-11 NOTE — MAU Provider Note (Signed)
Reviewed and agree with note and plan. V.Seith Aikey, MD  

## 2013-10-11 NOTE — MAU Note (Signed)
Pt presents to MAU with complaints of vaginal bleeding since Tuesday, she was evaluated here and was told her quant was low and she needed to follow up for repeat quant on Monday but is having more pain today

## 2013-10-11 NOTE — Progress Notes (Signed)
Notified of pt's request for pain. States she is coming in to evaluate pt.

## 2013-10-11 NOTE — Progress Notes (Signed)
Notified of pt in MAU for eval of continued cramping and bleeding. Orders rcvd.

## 2013-10-12 NOTE — MAU Provider Note (Signed)
Reviewed and agree with note and plan. V.Thaine Garriga, MD  

## 2014-02-09 ENCOUNTER — Encounter (HOSPITAL_COMMUNITY): Payer: Self-pay | Admitting: *Deleted

## 2014-02-11 LAB — OB RESULTS CONSOLE HIV ANTIBODY (ROUTINE TESTING): HIV: NONREACTIVE

## 2014-02-11 LAB — OB RESULTS CONSOLE ABO/RH: RH Type: POSITIVE

## 2014-02-11 LAB — OB RESULTS CONSOLE ANTIBODY SCREEN: ANTIBODY SCREEN: NEGATIVE

## 2014-02-11 LAB — OB RESULTS CONSOLE RPR: RPR: NONREACTIVE

## 2014-02-11 LAB — OB RESULTS CONSOLE RUBELLA ANTIBODY, IGM: RUBELLA: IMMUNE

## 2014-02-18 LAB — OB RESULTS CONSOLE GC/CHLAMYDIA
CHLAMYDIA, DNA PROBE: NEGATIVE
Gonorrhea: NEGATIVE

## 2014-02-23 IMAGING — US US OB COMP LESS 14 WK
1 series · 14 of 28 positions shown · non-contrast
Comparison: None

CLINICAL DATA: 36-year-old pregnant female with heavy vaginal
bleeding and pelvic pain.

OBSTETRIC <14 WK US AND TRANSVAGINAL OB US
TECHNIQUE: Both transabdominal and transvaginal ultrasound
examinations were performed for complete evaluation of the
gestation as well as the maternal uterus, adnexal regions, and
pelvic cul-de-sac.  Transvaginal technique was performed to assess
early pregnancy.

[Series 1: us ob comp less 14 wks · 38 acquisitions, 14 frames shown]
[im 2/38]
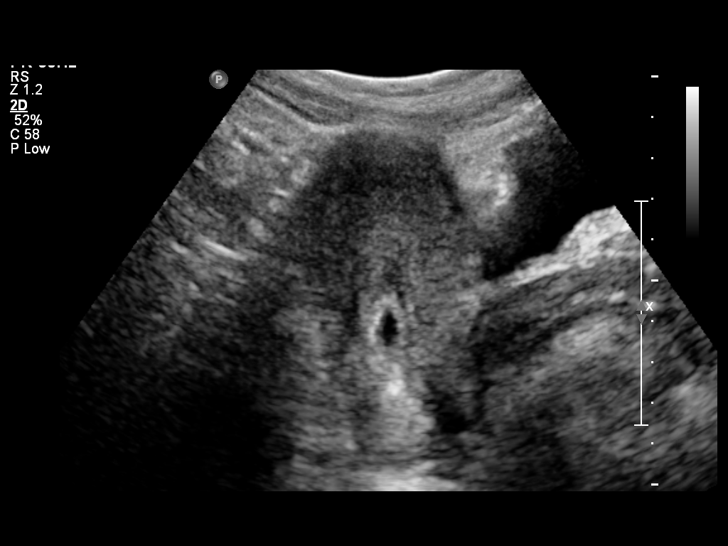
[im 5/38]
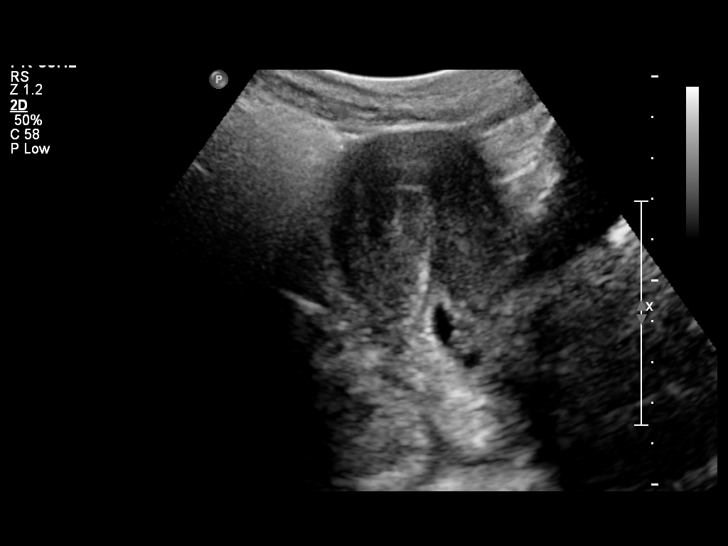
[im 7/38]
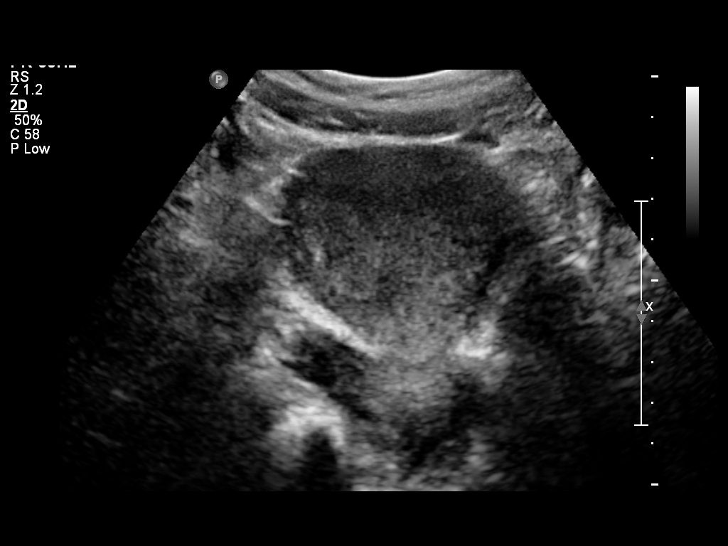
[im 10/38]
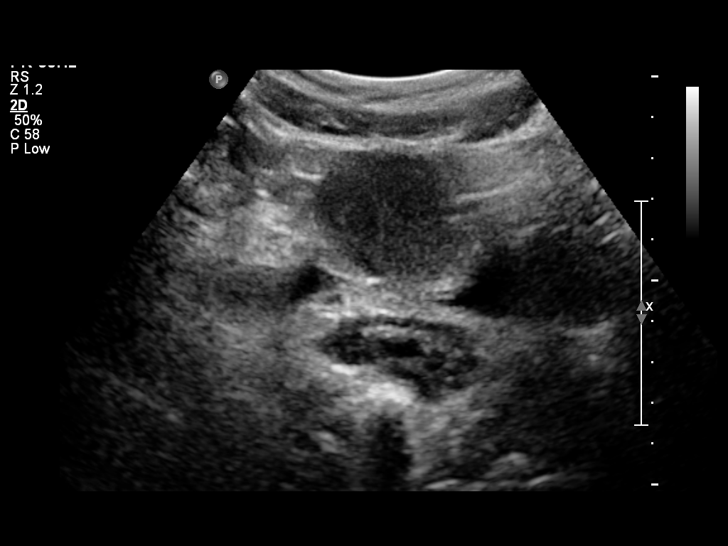
[im 13/38]
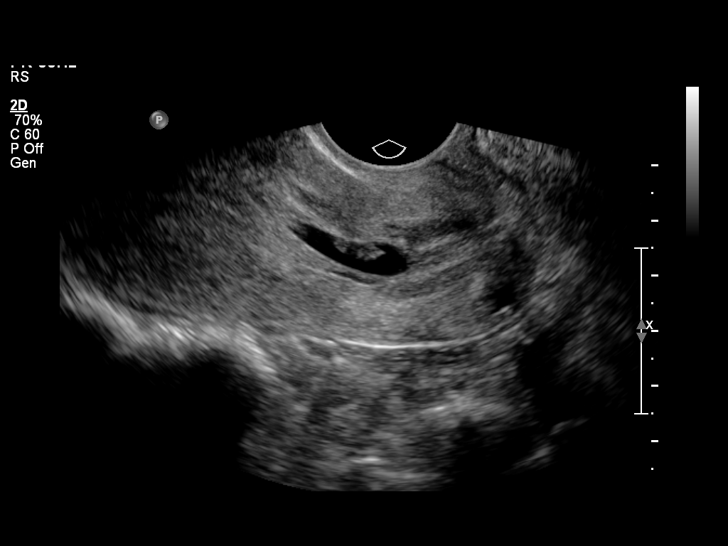
[im 16/38]
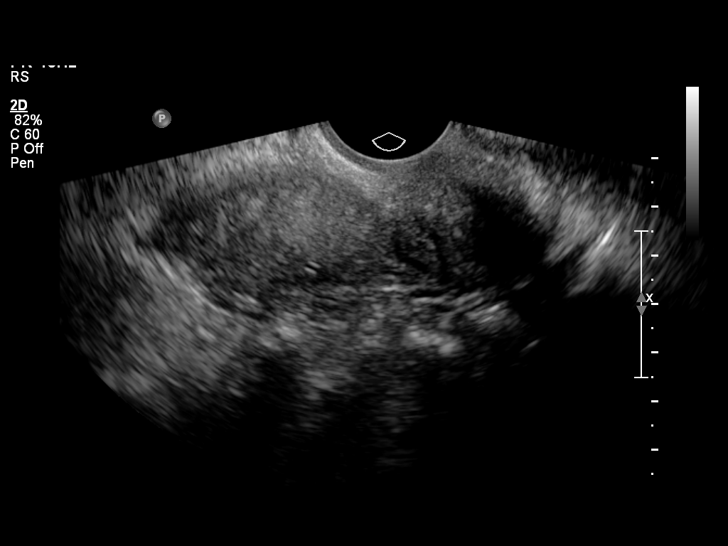
[im 18/38]
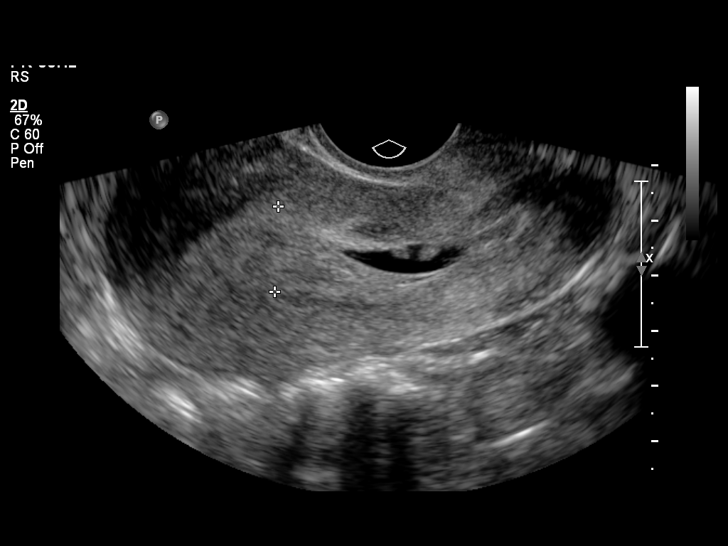
[im 21/38]
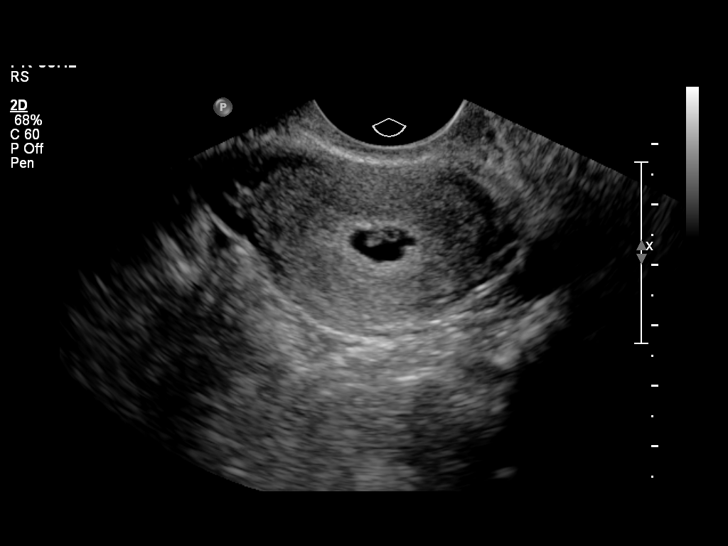
[im 24/38]
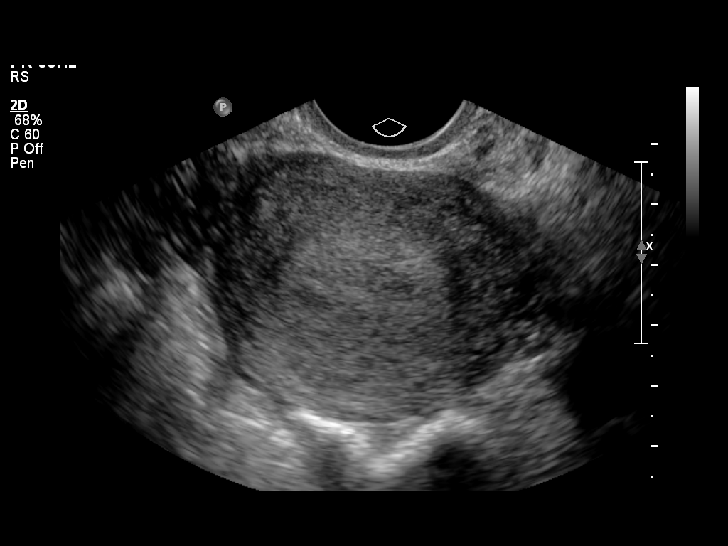
[im 27/38]
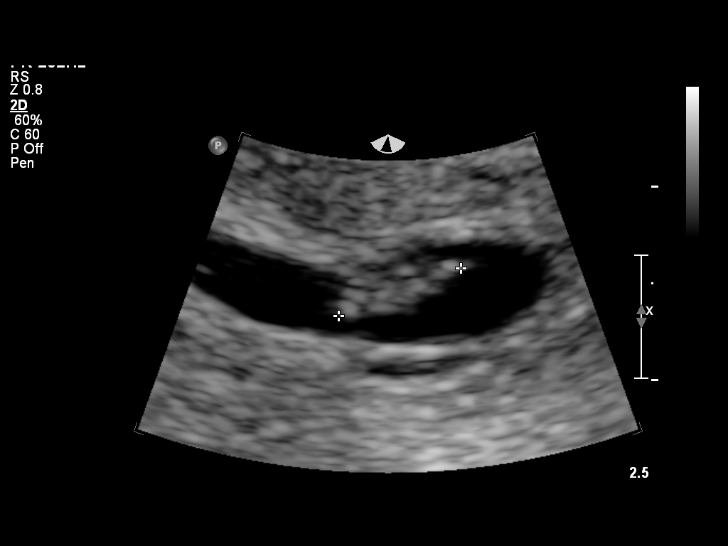
[im 29/38]
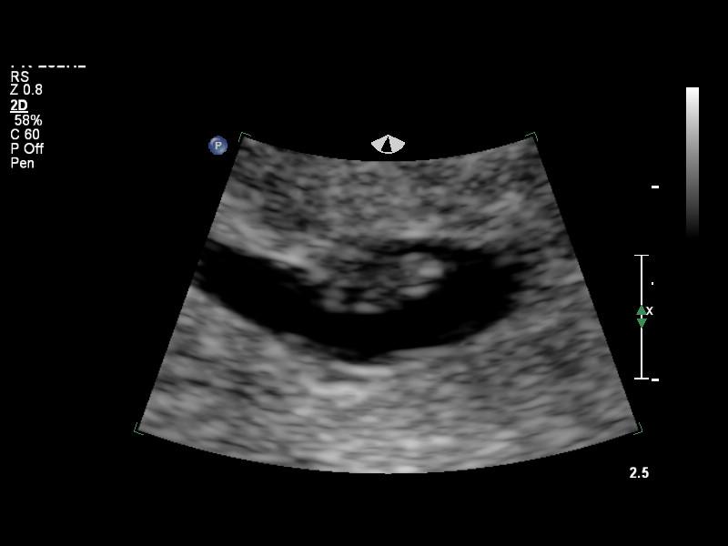
[im 32/38]
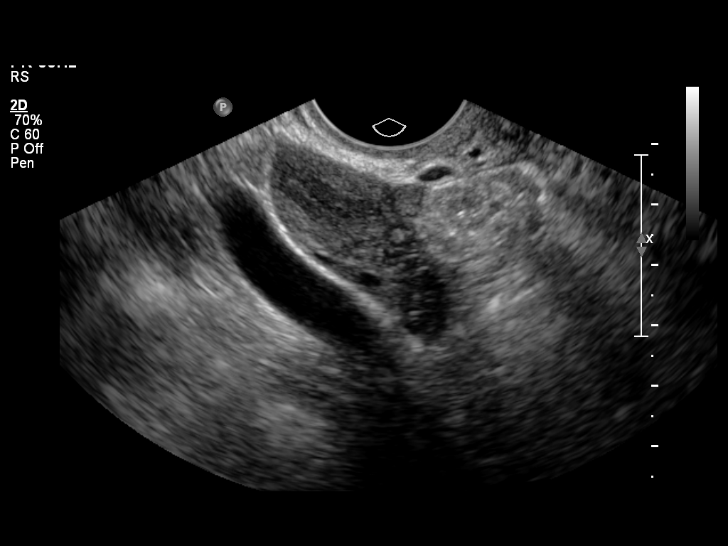
[im 35/38]
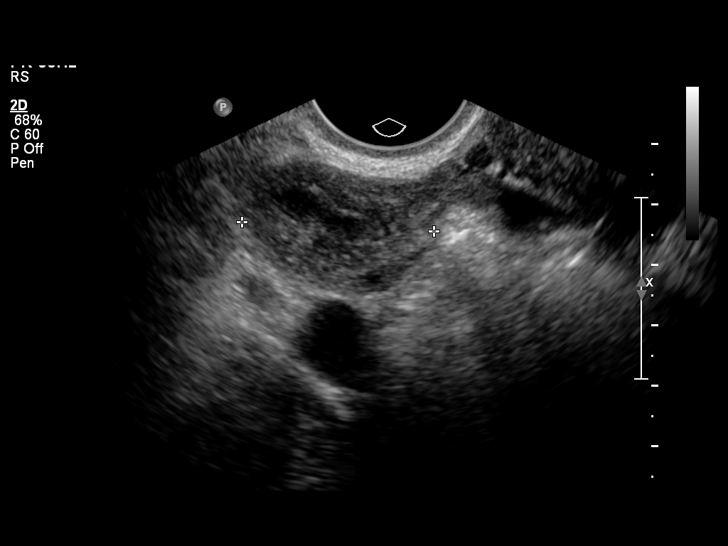
[im 38/38]
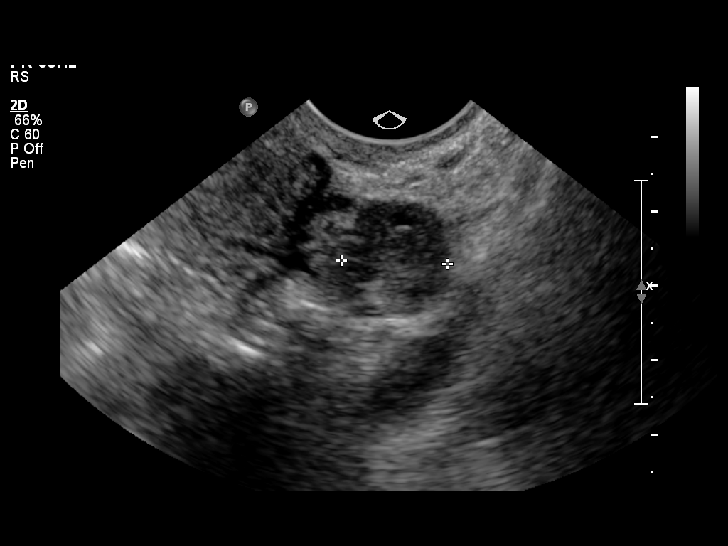

[14 of 28 positions shown; findings below may reference images not displayed]

Intrauterine gestational sac:  Visualized in the lower uterine
segment.
Yolk sac: Present
Embryo: Present
Cardiac Activity: None identified

CRL: 6.8 mm    6   w  4   d

Maternal uterus/adnexae:
A large subchorionic hemorrhage is noted.

The ovaries bilaterally are unremarkable.
There is no evidence of adnexal mass or free fluid.
IMPRESSION: Single intrauterine gestational sac in the lower uterine segment
containing a fetal pole but without cardiac activity, which should
be visualized at this time - compatible with failed first trimester
pregnancy/fetal demise.

Large subchorionic hemorrhage.

## 2014-03-04 ENCOUNTER — Encounter (HOSPITAL_COMMUNITY): Payer: Self-pay

## 2014-03-04 ENCOUNTER — Ambulatory Visit (HOSPITAL_COMMUNITY)
Admission: RE | Admit: 2014-03-04 | Discharge: 2014-03-04 | Disposition: A | Discharge status: Home / Self Care | Payer: Managed Care, Other (non HMO) | Source: Ambulatory Visit | Attending: Obstetrics & Gynecology | Admitting: Obstetrics & Gynecology

## 2014-03-04 DIAGNOSIS — Z315 Encounter for genetic counseling: Secondary | ICD-10-CM | POA: Insufficient documentation

## 2014-03-04 DIAGNOSIS — O285 Abnormal chromosomal and genetic finding on antenatal screening of mother: Secondary | ICD-10-CM | POA: Insufficient documentation

## 2014-03-04 NOTE — Progress Notes (Signed)
Genetic Counseling  High-Risk Gestation Note  Appointment Date:  03/04/2014 Referred By: Robley FriesMody, Vaishali R, MD Date of Birth:  1975/09/27   Pregnancy History: [id number] Estimated Date of Delivery: 09/06/14 Estimated Gestational Age: 4462w3d   Appointment Date: 03/04/2014 DOB: 1975/09/27 Referring Provider: Robley FriesMody, Vaishali R, MD Attending: Dr. Derinda SisJeff Denney  Mrs. Caesar Chestnutarolyn Y Mazzocco and her husband, Cheral AlmasKris Garner, were seen for genetic counseling because of a maternal age of 38 y.o. and an abnormal finding on an Informaseq cell free DNA screening.     They were counseled regarding maternal age and the association with risk for chromosome conditions due to nondisjunction.   We reviewed chromosomes, nondisjunction, and the associated 1 in 3866 risk for fetal aneuploidy related to a maternal age of 38 y.o. at 3362w3d gestation.  They were counseled that the risk for aneuploidy decreases as gestational age increases, accounting for those pregnancies which spontaneously abort.  We specifically discussed Down syndrome (trisomy 321), trisomies 7113 and 4018, and sex chromosome aneuploidies (47,XXX and 47,XXY) including the common features and prognoses of each.   We reviewed available screening options including First Screen, Quad screen, noninvasive prenatal screening (NIPS)/cell free DNA (cfDNA) testing, and detailed ultrasound.  They were counseled that screening tests are used to modify a patient's a priori risk for aneuploidy, typically based on age. This estimate provides a pregnancy specific risk assessment. We reviewed the benefits and limitations of each option. Specifically, we discussed the conditions for which each test screens, the detection rates, and false positive rates of each. They were also counseled regarding diagnostic testing via CVS and amniocentesis. We reviewed the approximate 1 in 300-500 risk for complications for amniocentesis, including spontaneous pregnancy loss.  We reviewed the results of her  Informaseq cell free DNA screening.  We discussed that it was reported as a "Borderline result suggestive of Trisomy 13".   We discussed some of the limitations of this technology including the analysis of placental DNA not fetal, and a reduced positive predictive value when considering Trisomy 13.  Given that a specific risk for Trisomy 13 was not quantified by the laboratory, it is unclear what was different about the sample that led to this type of interpretation.    We discussed available options to better clarify the chance for Trisomy 13 in this pregnancy.  Ms. Ruben Gottronoston was offered the option of a different version of NIPS (Panorama).  We discussed that while both tests analyzed placental DNA in maternal circulation, the methodology is different.  Additionally, while we expect there to be rapid clearance of cfDNA after delivery, Ms. Sopp did have an SAB in July which could be confounding these results (although it should not).  Panorama technology should be able to identify if the extra DNA is from a separate gestation vs. the current pregnancy (reported as vanishing twin).  Ms. Ruben Gottronoston declined CVS due to the risk of the procedure and the fact that it analyzes placental tissue not fetal.  She is, however, considering amniocentesis.  She was interested in Panorama and ultrasound to help her decide if she wished to proceed with amniocentesis at the appropriate time.  We again discussed the limitations of ultrasound in detection of chromosome conditions, especially at early gestations, but they were counseled that up to 90% of fetuses with Trisomy 13 will have detectable anomalies or soft markers when the fetal anatomy is well visualized at 18-20 weeks.  Panorama was drawn today, those results will be available in 8-10 days.    Ms.  Plourde was provided with written information regarding sickle cell anemia (SCA) including the carrier frequency and incidence in the African-American population, the availability  of carrier testing and prenatal diagnosis if indicated.  In addition, we discussed that hemoglobinopathies are routinely screened for as part of the [place] newborn screening panel.  She was uncertain if this had been tested for this previously.  Both family histories were reviewed.  Ms. Ruben Gottronoston reported that her sister has thalassemia.  She is not certain if her sister has alpha-thalassemia or beta thalassemia.  We briefly discussed both and the differences between them. They were counseled that they are both autosomal recessive conditions, and that both she and her husband would have to be carriers in order to have a child with thalassemia. A normal quantitative hemoglobin electrophoresis with a normal A2 would make it unlikely that Ms. Ruben Gottronoston was a carrier for beta thalassemia. The remainder of the family histories were reviewed and found to be noncontributory for birth defects, intellectual disability, and known genetic conditions.  Ms. Ruben Gottronoston will contact the genetic counselor directly if she learns more about her sister's diagnosis. Without further information regarding the provided family history, an accurate genetic risk cannot be calculated. Further genetic counseling is warranted if more information is obtained.  Ms. Ruben Gottronoston denied exposure to environmental toxins or chemical agents. She denied the use of alcohol, tobacco or street drugs. She denied significant viral illnesses during the course of her pregnancy. Her medical and surgical histories were noncontributory.   I counseled this couple regarding the above risks and available options.  The approximate face-to-face time with the genetic counselor was 60 minutes.  Azalia Bilisonrad,Zi Newbury M, MS,  Certified Genetic Counselor

## 2014-03-06 ENCOUNTER — Other Ambulatory Visit (HOSPITAL_COMMUNITY): Payer: Self-pay

## 2014-03-13 ENCOUNTER — Telehealth (HOSPITAL_COMMUNITY): Payer: Self-pay | Admitting: MS"

## 2014-03-13 NOTE — Telephone Encounter (Signed)
Mady Gemmaaragh Conrad, CGC, called Evelena PeatEric Garner, partner of Ms. Anabel BeneCarolyn Y Winward to discuss her cell free fetal DNA test results. The patient previously requested for Mr. Lanae BoastGarner to be contacted with these results.  Ms. Caesar ChestnutCarolyn Y Bouillon had [facility]ama testing [facility].  Testing was offered because of advanced maternal age and previous abnormal InformaSeq (cell free DNA testing) result.   The patient was identified by name and DOB.  We reviewed that these are within normal limits, showing a less than 1 in 10,000 risk for trisomies 21, 18 and 13, and monosomy X (Turner syndrome).  In addition, the risk for triploidy/vanishing twin and sex chromosome trisomies (47,XXX and 47,XXY) was also low risk. We reviewed that this testing identifies > 99% of pregnancies with trisomy 3421, trisomy 7413, sex chromosome trisomies (47,XXX and 47,XXY), and triploidy. The detection rate for trisomy 18 is 96%.  The detection rate for monosomy X is ~92%.  The false positive rate is <0.1% for all conditions. Testing was also consistent with female fetal sex.  The patient did wish to know fetal sex.  She understands that this testing does not identify all genetic conditions.  All questions were answered to her satisfaction, she was encouraged to call with additional questions or concerns.  Quinn PlowmanKaren Larena Ohnemus, MS Patent attorneyCertified Genetic Counselor

## 2014-03-16 ENCOUNTER — Other Ambulatory Visit (HOSPITAL_COMMUNITY): Payer: Self-pay

## 2014-04-10 NOTE — L&D Delivery Note (Signed)
Delivery Note At 1:06 PM a viable and healthy female was delivered via Vaginal, Spontaneous Delivery (Presentation: Occiput Posterior).  APGAR: 8, 9; weight -pending.   Placenta status: Intact, Spontaneous.  Cord: 3 vessels with the following complications: None.  Cord pH: n/a  Anesthesia: Epidural  Episiotomy: None Lacerations: right Labial Suture Repair: 3.0 vicryl rapide Est. Blood Loss (mL):  400 cc  Mom to postpartum.  Baby to Couplet care / Skin to Skin.  Asheton Viramontes R 08/28/2014, 1:32 PM

## 2014-08-14 ENCOUNTER — Encounter (HOSPITAL_COMMUNITY): Payer: Self-pay | Admitting: *Deleted

## 2014-08-19 LAB — OB RESULTS CONSOLE GBS: GBS: POSITIVE

## 2014-08-28 ENCOUNTER — Inpatient Hospital Stay (HOSPITAL_COMMUNITY): Payer: Managed Care, Other (non HMO) | Admitting: Anesthesiology

## 2014-08-28 ENCOUNTER — Encounter (HOSPITAL_COMMUNITY): Payer: Self-pay | Admitting: *Deleted

## 2014-08-28 ENCOUNTER — Inpatient Hospital Stay (HOSPITAL_COMMUNITY)
Admission: AD | Admit: 2014-08-28 | Discharge: 2014-08-30 | DRG: 775 | Disposition: A | Discharge status: Home / Self Care | Payer: Managed Care, Other (non HMO) | Source: Ambulatory Visit | Attending: Obstetrics & Gynecology | Admitting: Obstetrics & Gynecology

## 2014-08-28 DIAGNOSIS — D62 Acute posthemorrhagic anemia: Secondary | ICD-10-CM | POA: Diagnosis not present

## 2014-08-28 DIAGNOSIS — O99824 Streptococcus B carrier state complicating childbirth: Secondary | ICD-10-CM | POA: Diagnosis present

## 2014-08-28 DIAGNOSIS — O9081 Anemia of the puerperium: Secondary | ICD-10-CM | POA: Diagnosis not present

## 2014-08-28 DIAGNOSIS — Z3A38 38 weeks gestation of pregnancy: Secondary | ICD-10-CM | POA: Diagnosis present

## 2014-08-28 DIAGNOSIS — IMO0001 Reserved for inherently not codable concepts without codable children: Secondary | ICD-10-CM

## 2014-08-28 LAB — CBC
HCT: 33.2 % — ABNORMAL LOW (ref 36.0–46.0)
Hemoglobin: 11.3 g/dL — ABNORMAL LOW (ref 12.0–15.0)
MCH: 30.8 pg (ref 26.0–34.0)
MCHC: 34 g/dL (ref 30.0–36.0)
MCV: 90.5 fL (ref 78.0–100.0)
PLATELETS: 188 10*3/uL (ref 150–400)
RBC: 3.67 MIL/uL — AB (ref 3.87–5.11)
RDW: 15.4 % (ref 11.5–15.5)
WBC: 10 10*3/uL (ref 4.0–10.5)

## 2014-08-28 LAB — RPR: RPR Ser Ql: NONREACTIVE

## 2014-08-28 LAB — TYPE AND SCREEN
ABO/RH(D): O POS
Antibody Screen: NEGATIVE

## 2014-08-28 MED ORDER — IBUPROFEN 600 MG PO TABS
600.0000 mg | ORAL_TABLET | Freq: Four times a day (QID) | ORAL | Status: DC
  Administered 2014-08-28 – 2014-08-30 (×8): 600 mg via ORAL
  Filled 2014-08-28 (×8): qty 1

## 2014-08-28 MED ORDER — SIMETHICONE 80 MG PO CHEW: 80.0000 mg | CHEWABLE_TABLET | ORAL | Status: DC | PRN

## 2014-08-28 MED ORDER — PHENYLEPHRINE 40 MCG/ML (10ML) SYRINGE FOR IV PUSH (FOR BLOOD PRESSURE SUPPORT)
80.0000 ug | PREFILLED_SYRINGE | INTRAVENOUS | Status: DC | PRN
  Filled 2014-08-28: qty 20
  Filled 2014-08-28: qty 2

## 2014-08-28 MED ORDER — OXYCODONE-ACETAMINOPHEN 5-325 MG PO TABS: 2.0000 | ORAL_TABLET | ORAL | Status: DC | PRN

## 2014-08-28 MED ORDER — LIDOCAINE HCL (PF) 1 % IJ SOLN
INTRAMUSCULAR | Status: DC | PRN
  Administered 2014-08-28: 5 mL
  Administered 2014-08-28: 4 mL

## 2014-08-28 MED ORDER — BENZOCAINE-MENTHOL 20-0.5 % EX AERO
1.0000 "application " | INHALATION_SPRAY | CUTANEOUS | Status: DC | PRN
  Administered 2014-08-28 – 2014-08-30 (×2): 1 via TOPICAL
  Filled 2014-08-28 (×2): qty 56

## 2014-08-28 MED ORDER — TETANUS-DIPHTH-ACELL PERTUSSIS 5-2.5-18.5 LF-MCG/0.5 IM SUSP: 0.5000 mL | Freq: Once | INTRAMUSCULAR | Status: DC

## 2014-08-28 MED ORDER — SENNOSIDES-DOCUSATE SODIUM 8.6-50 MG PO TABS
2.0000 | ORAL_TABLET | ORAL | Status: DC
  Administered 2014-08-28 – 2014-08-30 (×2): 2 via ORAL
  Filled 2014-08-28 (×2): qty 2

## 2014-08-28 MED ORDER — DEXTROSE 5 % IV SOLN
5.0000 10*6.[IU] | Freq: Once | INTRAVENOUS | Status: AC
  Administered 2014-08-28: 5 10*6.[IU] via INTRAVENOUS
  Filled 2014-08-28: qty 5

## 2014-08-28 MED ORDER — FENTANYL 2.5 MCG/ML BUPIVACAINE 1/10 % EPIDURAL INFUSION (WH - ANES)
15.0000 mL/h | INTRAMUSCULAR | Status: DC | PRN
  Administered 2014-08-28: 15 mL/h via EPIDURAL

## 2014-08-28 MED ORDER — OXYTOCIN 40 UNITS IN LACTATED RINGERS INFUSION - SIMPLE MED
1.0000 m[IU]/min | INTRAVENOUS | Status: DC
  Administered 2014-08-28: 2 m[IU]/min via INTRAVENOUS
  Filled 2014-08-28: qty 1000

## 2014-08-28 MED ORDER — LACTATED RINGERS IV SOLN: 500.0000 mL | INTRAVENOUS | Status: DC | PRN

## 2014-08-28 MED ORDER — LACTATED RINGERS IV SOLN
INTRAVENOUS | Status: DC
  Administered 2014-08-28 (×2): via INTRAVENOUS

## 2014-08-28 MED ORDER — DIPHENHYDRAMINE HCL 50 MG/ML IJ SOLN: 12.5000 mg | INTRAMUSCULAR | Status: DC | PRN

## 2014-08-28 MED ORDER — ZOLPIDEM TARTRATE 5 MG PO TABS
5.0000 mg | ORAL_TABLET | Freq: Every evening | ORAL | Status: DC | PRN
Start: 2014-08-28 — End: 2014-08-30

## 2014-08-28 MED ORDER — FLEET ENEMA 7-19 GM/118ML RE ENEM: 1.0000 | ENEMA | RECTAL | Status: DC | PRN

## 2014-08-28 MED ORDER — DIBUCAINE 1 % RE OINT: 1.0000 "application " | TOPICAL_OINTMENT | RECTAL | Status: DC | PRN

## 2014-08-28 MED ORDER — PENICILLIN G POTASSIUM 5000000 UNITS IJ SOLR
2.5000 10*6.[IU] | INTRAVENOUS | Status: DC
  Administered 2014-08-28: 2.5 10*6.[IU] via INTRAVENOUS
  Filled 2014-08-28 (×6): qty 2.5

## 2014-08-28 MED ORDER — TERBUTALINE SULFATE 1 MG/ML IJ SOLN
0.2500 mg | Freq: Once | INTRAMUSCULAR | Status: DC | PRN
  Filled 2014-08-28: qty 1

## 2014-08-28 MED ORDER — ONDANSETRON HCL 4 MG/2ML IJ SOLN: 4.0000 mg | Freq: Four times a day (QID) | INTRAMUSCULAR | Status: DC | PRN

## 2014-08-28 MED ORDER — PRENATAL MULTIVITAMIN CH
1.0000 | ORAL_TABLET | Freq: Every day | ORAL | Status: DC
Start: 2014-08-29 — End: 2014-08-30
  Administered 2014-08-29 – 2014-08-30 (×2): 1 via ORAL
  Filled 2014-08-28 (×2): qty 1

## 2014-08-28 MED ORDER — EPHEDRINE 5 MG/ML INJ
10.0000 mg | INTRAVENOUS | Status: DC | PRN
  Filled 2014-08-28: qty 2

## 2014-08-28 MED ORDER — PHENYLEPHRINE 40 MCG/ML (10ML) SYRINGE FOR IV PUSH (FOR BLOOD PRESSURE SUPPORT)
80.0000 ug | PREFILLED_SYRINGE | INTRAVENOUS | Status: DC | PRN
  Filled 2014-08-28: qty 2

## 2014-08-28 MED ORDER — WITCH HAZEL-GLYCERIN EX PADS: 1.0000 "application " | MEDICATED_PAD | CUTANEOUS | Status: DC | PRN

## 2014-08-28 MED ORDER — OXYTOCIN 40 UNITS IN LACTATED RINGERS INFUSION - SIMPLE MED: 62.5000 mL/h | INTRAVENOUS | Status: DC

## 2014-08-28 MED ORDER — LIDOCAINE HCL (PF) 1 % IJ SOLN
30.0000 mL | INTRAMUSCULAR | Status: DC | PRN
  Filled 2014-08-28: qty 30

## 2014-08-28 MED ORDER — ACETAMINOPHEN 325 MG PO TABS: 650.0000 mg | ORAL_TABLET | ORAL | Status: DC | PRN

## 2014-08-28 MED ORDER — ONDANSETRON HCL 4 MG/2ML IJ SOLN: 4.0000 mg | INTRAMUSCULAR | Status: DC | PRN

## 2014-08-28 MED ORDER — LANOLIN HYDROUS EX OINT: TOPICAL_OINTMENT | CUTANEOUS | Status: DC | PRN

## 2014-08-28 MED ORDER — OXYCODONE-ACETAMINOPHEN 5-325 MG PO TABS
1.0000 | ORAL_TABLET | ORAL | Status: DC | PRN
  Filled 2014-08-28: qty 1

## 2014-08-28 MED ORDER — OXYTOCIN BOLUS FROM INFUSION: 500.0000 mL | INTRAVENOUS | Status: DC

## 2014-08-28 MED ORDER — DIPHENHYDRAMINE HCL 25 MG PO CAPS: 25.0000 mg | ORAL_CAPSULE | Freq: Four times a day (QID) | ORAL | Status: DC | PRN

## 2014-08-28 MED ORDER — OXYCODONE-ACETAMINOPHEN 5-325 MG PO TABS
1.0000 | ORAL_TABLET | ORAL | Status: DC | PRN
  Administered 2014-08-28 – 2014-08-30 (×3): 1 via ORAL
  Filled 2014-08-28 (×2): qty 1

## 2014-08-28 MED ORDER — FENTANYL 2.5 MCG/ML BUPIVACAINE 1/10 % EPIDURAL INFUSION (WH - ANES)
14.0000 mL/h | INTRAMUSCULAR | Status: DC | PRN
  Administered 2014-08-28: 14 mL/h via EPIDURAL
  Filled 2014-08-28: qty 125

## 2014-08-28 MED ORDER — ONDANSETRON HCL 4 MG PO TABS
4.0000 mg | ORAL_TABLET | ORAL | Status: DC | PRN
Start: 2014-08-28 — End: 2014-08-30

## 2014-08-28 MED ORDER — CITRIC ACID-SODIUM CITRATE 334-500 MG/5ML PO SOLN: 30.0000 mL | ORAL | Status: DC | PRN

## 2014-08-28 MED ORDER — OXYCODONE-ACETAMINOPHEN 5-325 MG PO TABS
2.0000 | ORAL_TABLET | ORAL | Status: DC | PRN
  Administered 2014-08-28 – 2014-08-30 (×6): 2 via ORAL
  Filled 2014-08-28 (×6): qty 2

## 2014-08-28 NOTE — MAU Note (Signed)
Pt reports uc's since 0200. Dt had spotting @ home, but none noted now. Pt reports + FM, denies LOF

## 2014-08-28 NOTE — Progress Notes (Signed)
Covering service for MD in OR  S:  Comfortable with epidural - no pain or pressure  O:  VS: Blood pressure 86/60, pulse 76, temperature 97.7 F (36.5 C), temperature source Axillary, resp. rate 20, height 5\' 11"  (1.803 m), weight 76.114 kg (167 lb 12.8 oz), last menstrual period 11/30/2013, unknown if currently breastfeeding.        FHR : baseline 130 / variability moderate / accelerations + / no decelerations        Toco: contractions every 2-3 minutes / mild        Cervix : defer exam        Membranes: intact - awaiting ABX completion to consider AROM augmentation  A: active labor     FHR category 1  P: ABX for GBS infusing      expectant management - consider AROM augmentation later AM      Christian for birth attendance if labor progression prior to MD availability      recheck 1-2 hours    Melinda Christian, Melinda Christian CNM, Melinda Christian, Melinda Christian 08/28/2014, 9:31 AM

## 2014-08-28 NOTE — Anesthesia Procedure Notes (Signed)
Epidural Patient location during procedure: OB Start time: 08/28/2014 6:12 AM  Staffing Anesthesiologist: Mal AmabileFOSTER, Tazia Illescas Performed by: anesthesiologist   Preanesthetic Checklist Completed: patient identified, site marked, surgical consent, pre-op evaluation, timeout performed, IV checked, risks and benefits discussed and monitors and equipment checked  Epidural Patient position: sitting Prep: site prepped and draped and DuraPrep Patient monitoring: continuous pulse ox and blood pressure Approach: midline Location: L3-L4 Injection technique: LOR air  Needle:  Needle type: Tuohy  Needle gauge: 17 G Needle length: 9 cm and 9 Needle insertion depth: 5 cm cm Catheter type: closed end flexible Catheter size: 19 Gauge Catheter at skin depth: 10 cm Test dose: negative  Assessment Events: blood not aspirated, injection not painful, no injection resistance, negative IV test and no paresthesia  Additional Notes Patient identified. Risks and benefits discussed including failed block, incomplete  Pain control, post dural puncture headache, nerve damage, paralysis, blood pressure Changes, nausea, vomiting, reactions to medications-both toxic and allergic and post Partum back pain. All questions were answered. Patient expressed understanding and wished to proceed. Sterile technique was used throughout procedure. Epidural site was Dressed with sterile barrier dressing. No paresthesias, signs of intravascular injection Or signs of intrathecal spread were encountered.  Patient was more comfortable after the epidural was dosed. Please see RN's note for documentation of vital signs and FHR which are stable.\

## 2014-08-28 NOTE — Progress Notes (Signed)
Patient ID: Melinda Christian, female   DOB: 01/17/1976, 39 y.o.   MRN: [id number] Subjective: Doing well, pain well controlled with Epidural. Denies pelvic pressure.   Objective: BP 107/69 mmHg  Pulse 72  Temp(Src) 97.7 F (36.5 C) (Axillary)  Resp 16  Ht 5\' 11"  (1.803 m)  Wt 167 lb 12.8 oz (76.114 kg)  BMI 23.41 kg/m2  LMP 11/30/2013 (Exact Date)   FHT:  FHR: 140 bpm, variability: moderate,  accelerations:  Present,  decelerations:  Absent UC:   irregular, every 3-5 minutes, mild to palpate SVE:   Dilation: 8 Effacement (%): 100 Station: -1 Exam by:: Rolene CourseS. Grindsteff, RN   Assessment / Plan: Spontaneous labor, progressing normally but augment with pitocin, ROP, place on peanut to help with rotation. PCN per protocol x 2 doses  Fetal Wellbeing:  Category I Pain Control:  Epidural  Anticipated MOD:  NSVD  Melinda Christian 08/28/2014, 11:41 AM

## 2014-08-28 NOTE — Progress Notes (Signed)
Admission nutrition screen triggered for unintentional weight loss > 10 lbs within the last month. .PNR indicates that there has been no weight loss and a 20 overall weight gain.  Patients chart reviewed and assessed  for nutritional risk. Patient is determined to be at low nutrition  risk.   Elisabeth CaraKatherine Quinterrius Errington M.Odis LusterEd. R.D. LDN Neonatal Nutrition Support Specialist/RD III Pager [phone]      Phone [phone]

## 2014-08-28 NOTE — Anesthesia Preprocedure Evaluation (Signed)
Anesthesia Evaluation  Patient identified by MRN, date of birth, ID band Patient awake    Reviewed: Allergy & Precautions, Patient's Chart, lab work & pertinent test results  Airway Mallampati: II  TM Distance: >3 FB Neck ROM: Full    Dental no notable dental hx. (+) Teeth Intact   Pulmonary neg pulmonary ROS,  breath sounds clear to auscultation  Pulmonary exam normal       Cardiovascular negative cardio ROS Normal cardiovascular examRhythm:Regular Rate:Normal     Neuro/Psych negative neurological ROS  negative psych ROS   GI/Hepatic negative GI ROS, Neg liver ROS,   Endo/Other  negative endocrine ROS  Renal/GU negative Renal ROS  negative genitourinary   Musculoskeletal negative musculoskeletal ROS (+)   Abdominal   Peds  Hematology  (+) anemia ,   Anesthesia Other Findings   Reproductive/Obstetrics (+) Pregnancy                             Anesthesia Physical Anesthesia Plan  ASA: II  Anesthesia Plan: Epidural   Post-op Pain Management:    Induction:   Airway Management Planned: Natural Airway  Additional Equipment:   Intra-op Plan:   Post-operative Plan:   Informed Consent: I have reviewed the patients History and Physical, chart, labs and discussed the procedure including the risks, benefits and alternatives for the proposed anesthesia with the patient or authorized representative who has indicated his/her understanding and acceptance.     Plan Discussed with: Anesthesiologist  Anesthesia Plan Comments:         Anesthesia Quick Evaluation

## 2014-08-28 NOTE — Lactation Note (Signed)
This note was copied from the chart of Melinda Mariel KanskyCarolyn Harling. Lactation Consultation Note Initial visit at 5 hours of age.  Mom reports several good feedings.  Mom has several questions regarding pumping and returning to work in 11 weeks.  All questions answered.  FOB holding baby asleep after recent feeding. [facility] given and discussed.  Encouraged to feed with early cues on demand.  Early newborn behavior discussed.  Hand expression demonstrated by mom with colostrum visible.  Encouraged mom to hand express before and after feedings.  Also discussed hand expression after pumping sessions when she returns to work.   Mom to call for assist as needed.    Patient Name: Melinda Mariel KanskyCarolyn Zellars ZOXWR'UToday's Date: 08/28/2014 Reason for consult: Initial assessment   Maternal Data Has patient been taught Hand Expression?: Yes Does the patient have breastfeeding experience prior to this delivery?: Yes  Feeding Feeding Type: Breast Fed Length of feed: 27 min  LATCH Score/Interventions Latch: Grasps breast easily, tongue down, lips flanged, rhythmical sucking.  Audible Swallowing: None Intervention(s): Skin to skin;Hand expression Intervention(s): Skin to skin;Hand expression  Type of Nipple: Everted at rest and after stimulation  Comfort (Breast/Nipple): Soft / non-tender     Hold (Positioning): No assistance needed to correctly position infant at breast. Intervention(s): Breastfeeding basics reviewed  LATCH Score: 8  Lactation Tools Discussed/Used     Consult Status Consult Status: Follow-up Date: 08/29/14 Follow-up type: In-patient    Jannifer RodneyShoptaw, Tykeisha Peer Lynn 08/28/2014, 6:09 PM

## 2014-08-28 NOTE — H&P (Addendum)
Caesar ChestnutCarolyn Y Umbach is a 39 y.o. female [phone] presenting for Spontaneous labor.  HPP:  Normal pregnancy Except borderline increased risk of T13 on Informaseq.  Panorama/NT wnl. GBS pos.  Prior term SVD 3rd degree.  OB History    Gravida Para Term Preterm AB TAB SAB Ectopic Multiple Living   4 1 1  2  2   1      Past Medical History  Diagnosis Date  . No pertinent past medical history   . Wrist pain     left  . Medical history non-contributory    Past Surgical History  Procedure Laterality Date  . Wisdom tooth extraction    . Knee arthroscopy    . Shoulder arthroscopy    . Dorsal compartment release Left 06/11/2013    Procedure: LEFT RELEASE DORSAL COMPARTMENT (DEQUERVAIN);  Surgeon: Nestor LewandowskyFrank J Rowan, MD;  Location: [facility];  Service: Orthopedics;  Laterality: Left;   Family History: family history is negative for Other. Social History:  reports that she has never smoked. She has never used smokeless tobacco. She reports that she drinks alcohol. She reports that she does not use illicit drugs.  No Known Allergies     Blood pressure 93/52, pulse 68, temperature 97.7 F (36.5 C), temperature source Axillary, resp. rate 20, height 5\' 11"  (1.803 m), weight 167 lb 12.8 oz (76.114 kg), last menstrual period 11/30/2013, unknown if currently breastfeeding. Exam Physical Exam   VE 8/100/Vtx/-1 fixed.  Left Occiput posterior. AROM  AF clear ++  FHR 120-130's with good variability, accelerations present, no deceleration. HPP:  Patient Active Problem List   Diagnosis Date Noted  . Active labor 08/28/2014  . Abnormal chromosomal and genetic finding on antenatal screening of mother 03/04/2014  . SVD (spontaneous vaginal delivery) w/ 3rd degree LAC 08/30/2012  . Postpartum care following vaginal delivery w 3rd (5/23) 08/30/2012    Prenatal labs: ABO, Rh: --/--/O POS (05/20 0520) Antibody: NEG (05/20 0520) Rubella:  Immune RPR: Nonreactive (11/04 0000)  HBsAg:   NR HIV: Non-reactive (11/04 0000)  Genetic testing: Informaseq borderline increased risk of T13, Panorama and NT wnl.  Seen by genetics/MFM. US anato: wnl 1 hr GTT: Abnormal.  3 hr GTT wnl GBS: Positive (05/11 0000)   Assessment/Plan: 38 5/7 wks spontaneous labor who progressed to active stage, but insufficient UCs currently.  AROM done.  Will add Pito PRN in 1 hour.  Covered x > 4hrs with Pen G for GBS pos.  FHR monitoring Cat 1.  Dr Juliene PinaMody will probably deliver her patient.   Errick Salts,MARIE-LYNE 08/28/2014, 10:42 AM

## 2014-08-28 NOTE — Anesthesia Postprocedure Evaluation (Signed)
  Anesthesia Post-op Note  Patient: Melinda ChestnutCarolyn Y Christian  Procedure(s) Performed: * No procedures listed *  Patient Location: Mother/Baby  Anesthesia Type:Epidural  Level of Consciousness: awake  Airway and Oxygen Therapy: Patient Spontanous Breathing  Post-op Pain: mild  Post-op Assessment: Patient's Cardiovascular Status Stable and Respiratory Function Stable  Post-op Vital Signs: stable  Last Vitals:  Filed Vitals:   08/28/14 1540  BP: 112/62  Pulse: 71  Temp: 36.8 C  Resp: 16    Complications: No apparent anesthesia complications

## 2014-08-29 ENCOUNTER — Encounter (HOSPITAL_COMMUNITY): Payer: Self-pay | Admitting: Obstetrics and Gynecology

## 2014-08-29 LAB — CBC
HCT: 29 % — ABNORMAL LOW (ref 36.0–46.0)
Hemoglobin: 9.7 g/dL — ABNORMAL LOW (ref 12.0–15.0)
MCH: 30.2 pg (ref 26.0–34.0)
MCHC: 33.4 g/dL (ref 30.0–36.0)
MCV: 90.3 fL (ref 78.0–100.0)
PLATELETS: 170 10*3/uL (ref 150–400)
RBC: 3.21 MIL/uL — ABNORMAL LOW (ref 3.87–5.11)
RDW: 15.5 % (ref 11.5–15.5)
WBC: 12.8 10*3/uL — ABNORMAL HIGH (ref 4.0–10.5)

## 2014-08-29 MED ORDER — POLYSACCHARIDE IRON COMPLEX 150 MG PO CAPS
150.0000 mg | ORAL_CAPSULE | Freq: Every day | ORAL | Status: DC
  Administered 2014-08-29 – 2014-08-30 (×2): 150 mg via ORAL
  Filled 2014-08-29 (×2): qty 1

## 2014-08-29 MED ORDER — DOCUSATE SODIUM 100 MG PO CAPS: 200.0000 mg | ORAL_CAPSULE | Freq: Every day | ORAL | Status: DC

## 2014-08-29 MED ORDER — POLYSACCHARIDE IRON COMPLEX 150 MG PO CAPS: 150.0000 mg | ORAL_CAPSULE | Freq: Two times a day (BID) | ORAL | Status: DC

## 2014-08-29 MED ORDER — MAGNESIUM OXIDE 400 (241.3 MG) MG PO TABS
200.0000 mg | ORAL_TABLET | Freq: Every day | ORAL | Status: DC
  Administered 2014-08-29 – 2014-08-30 (×2): 200 mg via ORAL
  Filled 2014-08-29 (×3): qty 0.5

## 2014-08-29 MED ORDER — POLYETHYLENE GLYCOL 3350 17 G PO PACK
17.0000 g | PACK | Freq: Every day | ORAL | Status: DC
  Filled 2014-08-29 (×2): qty 1

## 2014-08-29 NOTE — Progress Notes (Addendum)
Patient ID: Melinda ChestnutCarolyn Y Christian, female   DOB: [birth year], 39 y.o.   MRN: [id number] PPD # 1 SVD   S:  Reports feeling tired             Tolerating po/ No nausea or vomiting             Bleeding is light             Pain controlled with ibuprofen (OTC) and narcotic analgesics including Percocet             Up ad lib / ambulatory / voiding without difficulties    Newborn  Information for the patient's newborn:  Melinda Christian, Melinda Christian [[id number]][[id number]]  female  breast feeding    O:  A & O x 3, in no apparent distress              VS:  Filed Vitals:   08/28/14 1540 08/28/14 1707 08/28/14 1942 08/29/14 0601  BP: 112/62 112/63 103/54 107/53  Pulse: 71 71 72 68  Temp: 98.2 F (36.8 C) 97.5 F (36.4 C) 98.1 F (36.7 C) 98.4 F (36.9 C)  TempSrc: Oral Oral Oral Oral  Resp: 16  16 18   Height:      Weight:        LABS:  Recent Labs  08/28/14 0520 08/29/14 0630  WBC 10.0 12.8*  HGB 11.3* 9.7*  HCT 33.2* 29.0*  PLT 188 170    Blood type: O POS (05/20 0520)  Rubella: Immune (11/04 0000)     Lungs: Clear and unlabored  Heart: regular rate and rhythm / no murmurs  Abdomen: soft, non-tender, non-distended              Fundus: firm, non-tender, U-1  Perineum: repair healing well - no edema  Lochia: minimal  Extremities: no edema, no calf pain or tenderness, no Homans    A/P: PPD # 1  39 y.o., [id number]   Principal Problem:    Postpartum care following vaginal delivery (5/20) with Rt labial laceration  ABL Anemia   Doing well - stable status  Routine post partum orders  Start Niferex 150 mg daily  Start Magnesium Oxide 200 mg daily  Start bowel regimen / Miralax and Colace daily  Anticipate d/c home tomorrow      Raelyn MoraAWSON, Zaniel Marineau, M, MSN, CNM 08/29/2014, 9:02 AM   MD correction- Pt has a small right labial laceration NOT 3rd degree perineal. V.Mody, MD

## 2014-08-30 MED ORDER — BENZOCAINE-MENTHOL 20-0.5 % EX AERO: 1.0000 "application " | INHALATION_SPRAY | Freq: Three times a day (TID) | CUTANEOUS | Status: DC | PRN

## 2014-08-30 MED ORDER — POLYSACCHARIDE IRON COMPLEX 150 MG PO CAPS: 150.0000 mg | ORAL_CAPSULE | Freq: Every day | ORAL | Status: DC

## 2014-08-30 MED ORDER — OXYCODONE-ACETAMINOPHEN 5-325 MG PO TABS: 2.0000 | ORAL_TABLET | ORAL | Status: DC | PRN

## 2014-08-30 NOTE — Progress Notes (Signed)
Patient ID: Melinda Christian, female   DOB: September 23, 1975, 39 y.o.   MRN: [id number] PPD # 2 - SVD   S:  Reports feeling very sore             Tolerating po/ No nausea or vomiting             Bleeding is light             Pain controlled with ibuprofen (OTC) and narcotic analgesics including Percocet             Up ad lib / ambulatory / voiding without difficulties    Newborn  Information for the patient's newborn:  [name], Girl Zamiya [[id number]][[id number]]  female   breast feeding    O:  A & O x 3, in no apparent distress              VS:  Filed Vitals:   08/28/14 1942 08/29/14 0601 08/29/14 1748 08/30/14 0617  BP: 103/54 107/53 105/56 93/59  Pulse: 72 68 73 64  Temp: 98.1 F (36.7 C) 98.4 F (36.9 C) 98.1 F (36.7 C) 98.4 F (36.9 C)  TempSrc: Oral Oral Oral Oral  Resp: 16 18 18 17   Height:      Weight:        LABS:   Recent Labs  08/28/14 0520 08/29/14 0630  WBC 10.0 12.8*  HGB 11.3* 9.7*  HCT 33.2* 29.0*  PLT 188 170    Blood type: O POS (05/20 0520)  Rubella: Immune (11/04 0000)     Lungs: Clear and unlabored  Heart: regular rate and rhythm / no murmurs  Abdomen: soft, non-tender, non-distended              Fundus: firm, non-tender, U-2  Perineum: Rt labial repair healing well - no edema  Lochia: minimal  Extremities: no edema, no calf pain or tenderness, no Homans    A/P: PPD # 2  39 y.o., [id number] / S/P SVD with Rt labial laceration  Principal Problem:    Postpartum care following vaginal delivery (5/20)   ABL Anemia   Doing well - stable status  Routine post partum orders  D/C home today      Raelyn MoraAWSON, Glynis Hunsucker, M, MSN, CNM 08/30/2014, 10:04 AM

## 2014-08-30 NOTE — Discharge Instructions (Signed)
Breast Pumping Tips °If you are breastfeeding, there may be times when you cannot feed your baby directly. Returning to work or going on a trip are common examples. Pumping allows you to store breast milk and feed it to your baby later.  °You may not get much milk when you first start to pump. Your breasts should start to make more after a few days. If you pump at the times you usually feed your baby, you may be able to keep making enough milk to feed your baby without also using formula. The more often you pump, the more milk you will produce.  °WHEN SHOULD I PUMP?  °· You can begin to pump soon after delivery. However, some experts recommend waiting about 4 weeks before giving your infant a bottle to make sure breastfeeding is going well.  °· If you plan to return to work, begin pumping a few weeks before. This will help you develop techniques that work best for you. It also lets you build up a supply of breast milk.   °· When you are with your infant, feed on demand and pump after each feeding.   °· When you are away from your infant for several hours, pump for about 15 minutes every 2-3 hours. Pump both breasts at the same time if you can.   °· If your infant has a formula feeding, make sure to pump around the same time.     °· If you drink any alcohol, wait 2 hours before pumping.   °HOW DO I PREPARE TO PUMP? °Your let-down reflex is the natural reaction to stimulation that makes your breast milk flow. It is easier to stimulate this reflex when you are relaxed. Find relaxation techniques that work for you. If you have difficulty with your let-down reflex, try these methods:  °· Smell one of your infant's blankets or an item of clothing.   °· Look at a picture or video of your infant.   °· Sit in a quiet, private space.   °· Massage the breast you plan to pump.   °· Place soothing warmth on the breast.   °· Play relaxing music.   °WHAT ARE SOME GENERAL BREAST PUMPING TIPS? °· Wash your hands before you pump. You  do not need to wash your nipples or breasts. °· There are three ways to pump. °¨ You can use your hand to massage and compress your breast. °¨ You can use a handheld manual pump. °¨ You can use an electric pump.   °· Make sure the suction cup (flange) on the breast pump is the right size. Place the flange directly over the nipple. If it is the wrong size or placed the wrong way, it may be painful and cause nipple damage.   °· If pumping is uncomfortable, apply a small amount of purified or modified lanolin to your nipple and areola. °· If you are using an electric pump, adjust the speed and suction power to be more comfortable. °· If pumping is painful or if you are not getting very much milk, you may need a different type of pump. A lactation consultant can help you determine what type of pump to use.   °· Keep a full water bottle near you at all times. Drinking lots of fluid helps you make more milk.  °· You can store your milk to use later. Pumped breast milk can be stored in a sealable, sterile container or plastic bag. Label all stored breast milk with the date you pumped it. °¨ Milk can stay out at room temperature for up to 8 hours. °¨   You can store your milk in the refrigerator for up to 8 days.  You can store your milk in the freezer for 3 months. Thaw frozen milk using warm water. Do not put it in the microwave.  Do not smoke. Smoking can lower your milk supply and harm your infant. If you need help quitting, ask your health care provider to recommend a program.  WHEN SHOULD I CALL MY HEALTH CARE PROVIDER OR A LACTATION CONSULTANT?  You are having trouble pumping.  You are concerned that you are not making enough milk.  You have nipple pain, soreness, or redness.  You want to use birth control. Birth control pills may lower your milk supply. Talk to your health care provider about your options. Document Released: 09/14/2009 Document Revised: 04/01/2013 Document Reviewed:  01/17/2013 [facility] Patient Information 2015 [place], [place]. This information is not intended to replace advice given to you by your health care provider. Make sure you discuss any questions you have with your health care provider.  Postpartum Depression and Baby Blues The postpartum period begins right after the birth of a baby. During this time, there is often a great amount of joy and excitement. It is also a time of many changes in the life of the parents. Regardless of how many times a mother gives birth, each child brings new challenges and dynamics to the family. It is not unusual to have feelings of excitement along with confusing shifts in moods, emotions, and thoughts. All mothers are at risk of developing postpartum depression or the "baby blues." These mood changes can occur right after giving birth, or they may occur many months after giving birth. The baby blues or postpartum depression can be mild or severe. Additionally, postpartum depression can go away rather quickly, or it can be a long-term condition.  CAUSES Raised hormone levels and the rapid drop in those levels are thought to be a main cause of postpartum depression and the baby blues. A number of hormones change during and after pregnancy. Estrogen and progesterone usually decrease right after the delivery of your baby. The levels of thyroid hormone and various cortisol steroids also rapidly drop. Other factors that play a role in these mood changes include major life events and genetics.  RISK FACTORS If you have any of the following risks for the baby blues or postpartum depression, know what symptoms to watch out for during the postpartum period. Risk factors that may increase the likelihood of getting the baby blues or postpartum depression include:  Having a personal or family history of depression.   Having depression while being pregnant.   Having premenstrual mood issues or mood issues related to oral  contraceptives.  Having a lot of life stress.   Having marital conflict.   Lacking a social support network.   Having a baby with special needs.   Having health problems, such as diabetes.  SIGNS AND SYMPTOMS Symptoms of baby blues include:  Brief changes in mood, such as going from extreme happiness to sadness.  Decreased concentration.   Difficulty sleeping.   Crying spells, tearfulness.   Irritability.   Anxiety.  Symptoms of postpartum depression typically begin within the first month after giving birth. These symptoms include:  Difficulty sleeping or excessive sleepiness.   Marked weight loss.   Agitation.   Feelings of worthlessness.   Lack of interest in activity or food.  Postpartum psychosis is a very serious condition and can be dangerous. Fortunately, it is rare. Displaying any of  the following symptoms is cause for immediate medical attention. Symptoms of postpartum psychosis include:   Hallucinations and delusions.   Bizarre or disorganized behavior.   Confusion or disorientation.  DIAGNOSIS  A diagnosis is made by an evaluation of your symptoms. There are no medical or lab tests that lead to a diagnosis, but there are various questionnaires that a health care provider may use to identify those with the baby blues, postpartum depression, or psychosis. Often, a screening tool called the [place] Postnatal Depression Scale is used to diagnose depression in the postpartum period.  TREATMENT The baby blues usually goes away on its own in 1-2 weeks. Social support is often all that is needed. You will be encouraged to get adequate sleep and rest. Occasionally, you may be given medicines to help you sleep.  Postpartum depression requires treatment because it can last several months or longer if it is not treated. Treatment may include individual or group therapy, medicine, or both to address any social, physiological, and psychological factors  that may play a role in the depression. Regular exercise, a healthy diet, rest, and social support may also be strongly recommended.  Postpartum psychosis is more serious and needs treatment right away. Hospitalization is often needed. HOME CARE INSTRUCTIONS  Get as much rest as you can. Nap when the baby sleeps.   Exercise regularly. Some women find yoga and walking to be beneficial.   Eat a balanced and nourishing diet.   Do little things that you enjoy. Have a cup of tea, take a bubble bath, read your favorite magazine, or listen to your favorite music.  Avoid alcohol.   Ask for help with household chores, cooking, grocery shopping, or running errands as needed. Do not try to do everything.   Talk to people close to you about how you are feeling. Get support from your partner, family members, friends, or other new moms.  Try to stay positive in how you think. Think about the things you are grateful for.   Do not spend a lot of time alone.   Only take over-the-counter or prescription medicine as directed by your health care provider.  Keep all your postpartum appointments.   Let your health care provider know if you have any concerns.  SEEK MEDICAL CARE IF: You are having a reaction to or problems with your medicine. SEEK IMMEDIATE MEDICAL CARE IF:  You have suicidal feelings.   You think you may harm the baby or someone else. MAKE SURE YOU:  Understand these instructions.  Will watch your condition.  Will get help right away if you are not doing well or get worse. Document Released: 12/30/2003 Document Revised: 04/01/2013 Document Reviewed: 01/06/2013 [facility] Patient Information 2015 [place], [place]. This information is not intended to replace advice given to you by your health care provider. Make sure you discuss any questions you have with your health care provider.  Breastfeeding and Mastitis Mastitis is inflammation of the breast tissue. It can occur in  women who are breastfeeding. This can make breastfeeding painful. Mastitis will sometimes go away on its own. Your health care provider will help determine if treatment is needed. CAUSES Mastitis is often associated with a blocked milk (lactiferous) duct. This can happen when too much milk builds up in the breast. Causes of excess milk in the breast can include:  Poor latch-on. If your baby is not latched onto the breast properly, she or he may not empty your breast completely while breastfeeding.  Allowing too  much time to pass between feedings.  Wearing a bra or other clothing that is too tight. This puts extra pressure on the lactiferous ducts so milk does not flow through them as it should. Mastitis can also be caused by a bacterial infection. Bacteria may enter the breast tissue through cuts or openings in the skin. In women who are breastfeeding, this may occur because of cracked or irritated skin. Cracks in the skin are often caused when your baby does not latch on properly to the breast. SIGNS AND SYMPTOMS  Swelling, redness, tenderness, and pain in an area of the breast.  Swelling of the glands under the arm on the same side.  Fever may or may not accompany mastitis. If an infection is allowed to progress, a collection of pus (abscess) may develop. DIAGNOSIS  Your health care provider can usually diagnose mastitis based on your symptoms and a physical exam. Tests may be done to help confirm the diagnosis. These may include:  Removal of pus from the breast by applying pressure to the area. This pus can be examined in the lab to determine which bacteria are present. If an abscess has developed, the fluid in the abscess can be removed with a needle. This can also be used to confirm the diagnosis and determine the bacteria present. In most cases, pus will not be present.  Blood tests to determine if your body is fighting a bacterial infection.  Mammogram or ultrasound tests to rule out  other problems or diseases. TREATMENT  Mastitis that occurs with breastfeeding will sometimes go away on its own. Your health care provider may choose to wait 24 hours after first seeing you to decide whether a prescription medicine is needed. If your symptoms are worse after 24 hours, your health care provider will likely prescribe an antibiotic medicine to treat the mastitis. He or she will determine which bacteria are most likely causing the infection and will then select an appropriate antibiotic medicine. This is sometimes changed based on the results of tests performed to identify the bacteria, or if there is no response to the antibiotic medicine selected. Antibiotic medicines are usually given by mouth. You may also be given medicine for pain. HOME CARE INSTRUCTIONS  Only take over-the-counter or prescription medicines for pain, fever, or discomfort as directed by your health care provider.  If your health care provider prescribed an antibiotic medicine, take the medicine as directed. Make sure you finish it even if you start to feel better.  Do not wear a tight or underwire bra. Wear a soft, supportive bra.  Increase your fluid intake, especially if you have a fever.  Continue to empty the breast. Your health care provider can tell you whether this milk is safe for your infant or needs to be thrown out. You may be told to stop nursing until your health care provider thinks it is safe for your baby. Use a breast pump if you are advised to stop nursing.  Keep your nipples clean and dry.  Empty the first breast completely before going to the other breast. If your baby is not emptying your breasts completely for some reason, use a breast pump to empty your breasts.  If you go back to work, pump your breasts while at work to stay in time with your nursing schedule.  Avoid allowing your breasts to become overly filled with milk (engorged). SEEK MEDICAL CARE IF:  You have pus-like discharge  from the breast.  Your symptoms do not  improve with the treatment prescribed by your health care provider within 2 days. SEEK IMMEDIATE MEDICAL CARE IF:  Your pain and swelling are getting worse.  You have pain that is not controlled with medicine.  You have a red line extending from the breast toward your armpit.  You have a fever or persistent symptoms for more than 2-3 days.  You have a fever and your symptoms suddenly get worse. MAKE SURE YOU:   Understand these instructions.  Will watch your condition.  Will get help right away if you are not doing well or get worse. Document Released: 07/22/2004 Document Revised: 04/01/2013 Document Reviewed: 10/31/2012 [facility] Patient Information 2015 [place], [place]. This information is not intended to replace advice given to you by your health care provider. Make sure you discuss any questions you have with your health care provider.   Nutrition for the New Mother  A new mother needs good health and nutrition so she can have energy to take care of a new baby. Whether a mother breastfeeds or formula feeds the baby, it is important to have a well-balanced diet. Foods from all the food groups should be chosen to meet the new mother's energy needs and to give her the nutrients needed for repair and healing.  A HEALTHY EATING PLAN The My Pyramid plan for Moms outlines what you should eat to help you and your baby stay healthy. The energy and amount of food you need depends on whether or not you are breastfeeding. If you are breastfeeding you will need more nutrients. If you choose not to breastfeed, your nutrition goal should be to return to a healthy weight. Limiting calories may be needed if you are not breastfeeding.  HOME CARE INSTRUCTIONS   For a personal plan based on your unique needs, see your Registered Dietitian or visit [email or web address].  Eat a variety of foods. The plan below will help guide you. The following chart has a suggested  daily meal plan from the My [employer] for Moms.  Eat a variety of fruits and vegetables.  Eat more dark green and orange vegetables and cooked dried beans.  Make half your grains whole grains. Choose whole instead of refined grains.  Choose low-fat or lean meats and poultry.  Choose low-fat or fat-free dairy products like milk, cheese, or yogurt. Fruits  Breastfeeding: 2 cups  Non-Breastfeeding: 2 cups  What Counts as a serving?  1 cup of fruit or juice.   cup dried fruit. Vegetables  Breastfeeding: 3 cups  Non-Breastfeeding: 2  cups  What Counts as a serving?  1 cup raw or cooked vegetables.  Juice or 2 cups raw leafy vegetables. Grains  Breastfeeding: 8 oz  Non-Breastfeeding: 6 oz  What Counts as a serving?  1 slice bread.  1 oz ready-to-eat cereal.   cup cooked pasta, rice, or cereal. Meat and Beans  Breastfeeding: 6  oz  Non-Breastfeeding: 5  oz  What Counts as a serving?  1 oz lean meat, poultry, or fish   cup cooked dry beans   oz nuts or 1 egg  1 tbs peanut butter Milk  Breastfeeding: 3 cups  Non-Breastfeeding: 3 cups  What Counts as a serving?  1 cup milk.  8 oz yogurt.  1  oz cheese.  2 oz processed cheese. TIPS FOR THE BREASTFEEDING MOM  Rapid weight loss is not suggested when you are breastfeeding. By simply breastfeeding, you will be able to lose the weight gained during your pregnancy. Your caregiver can keep  track of your weight and tell you if your weight loss is appropriate.  Be sure to drink fluids. You may notice that you are thirstier than usual. A suggestion is to drink a glass of water or other beverage whenever you breastfeed.  Avoid alcohol as it can be passed into your breast milk.  Limit caffeine drinks to no more than 2 to 3 cups per day.  You may need to keep taking your prenatal vitamin while you are breastfeeding. Talk with your caregiver about taking a vitamin or supplement. RETURING TO A  HEALTHY WEIGHT  The My Pyramid Plan for Moms will help you return to a healthy weight. It will also provide the nutrients you need.  You may need to limit "empty" calories. These include:  High fat foods like fried foods, fatty meats, fast food, butter, and mayonnaise.  High sugar foods like sodas, jelly, candy, and sweets.  Be physically active. Include 30 minutes of exercise or more each day. Choose an activity you like such as walking, swimming, biking, or aerobics. Check with your caregiver before you start to exercise. Document Released: 07/04/2007 Document Revised: 06/19/2011 Document Reviewed: 07/04/2007 [facility] Patient Information 2015 [employer], [place]. This information is not intended to replace advice given to you by your health care provider. Make sure you discuss any questions you have with your health care provider. Breastfeeding Deciding to breastfeed is one of the best choices you can make for you and your baby. A change in hormones during pregnancy causes your breast tissue to grow and increases the number and size of your milk ducts. These hormones also allow proteins, sugars, and fats from your blood supply to make breast milk in your milk-producing glands. Hormones prevent breast milk from being released before your baby is born as well as prompt milk flow after birth. Once breastfeeding has begun, thoughts of your baby, as well as his or her sucking or crying, can stimulate the release of milk from your milk-producing glands.  BENEFITS OF BREASTFEEDING For Your Baby  Your first milk (colostrum) helps your baby's digestive system function better.   There are antibodies in your milk that help your baby fight off infections.   Your baby has a lower incidence of asthma, allergies, and sudden infant death syndrome.   The nutrients in breast milk are better for your baby than infant formulas and are designed uniquely for your baby's needs.   Breast milk improves your  baby's brain development.   Your baby is less likely to develop other conditions, such as childhood obesity, asthma, or type 2 diabetes mellitus.  For You   Breastfeeding helps to create a very special bond between you and your baby.   Breastfeeding is convenient. Breast milk is always available at the correct temperature and costs nothing.   Breastfeeding helps to burn calories and helps you lose the weight gained during pregnancy.   Breastfeeding makes your uterus contract to its prepregnancy size faster and slows bleeding (lochia) after you give birth.   Breastfeeding helps to lower your risk of developing type 2 diabetes mellitus, osteoporosis, and breast or ovarian cancer later in life. SIGNS THAT YOUR BABY IS HUNGRY Early Signs of Hunger  Increased alertness or activity.  Stretching.  Movement of the head from side to side.  Movement of the head and opening of the mouth when the corner of the mouth or cheek is stroked (rooting).  Increased sucking sounds, smacking lips, cooing, sighing, or squeaking.  Hand-to-mouth movements.  Increased sucking of fingers or hands. Late Signs of Hunger  Fussing.  Intermittent crying. Extreme Signs of Hunger Signs of extreme hunger will require calming and consoling before your baby will be able to breastfeed successfully. Do not wait for the following signs of extreme hunger to occur before you initiate breastfeeding:   Restlessness.  A loud, strong cry.   Screaming. BREASTFEEDING BASICS Breastfeeding Initiation  Find a comfortable place to sit or lie down, with your neck and back well supported.  Place a pillow or rolled up blanket under your baby to bring him or her to the level of your breast (if you are seated). Nursing pillows are specially designed to help support your arms and your baby while you breastfeed.  Make sure that your baby's abdomen is facing your abdomen.   Gently massage your breast. With your  fingertips, massage from your chest wall toward your nipple in a circular motion. This encourages milk flow. You may need to continue this action during the feeding if your milk flows slowly.  Support your breast with 4 fingers underneath and your thumb above your nipple. Make sure your fingers are well away from your nipple and your baby's mouth.   Stroke your baby's lips gently with your finger or nipple.   When your baby's mouth is open wide enough, quickly bring your baby to your breast, placing your entire nipple and as much of the colored area around your nipple (areola) as possible into your baby's mouth.   More areola should be visible above your baby's upper lip than below the lower lip.   Your baby's tongue should be between his or her lower gum and your breast.   Ensure that your baby's mouth is correctly positioned around your nipple (latched). Your baby's lips should create a seal on your breast and be turned out (everted).  It is common for your baby to suck about 2-3 minutes in order to start the flow of breast milk. Latching Teaching your baby how to latch on to your breast properly is very important. An improper latch can cause nipple pain and decreased milk supply for you and poor weight gain in your baby. Also, if your baby is not latched onto your nipple properly, he or she may swallow some air during feeding. This can make your baby fussy. Burping your baby when you switch breasts during the feeding can help to get rid of the air. However, teaching your baby to latch on properly is still the best way to prevent fussiness from swallowing air while breastfeeding. Signs that your baby has successfully latched on to your nipple:    Silent tugging or silent sucking, without causing you pain.   Swallowing heard between every 3-4 sucks.    Muscle movement above and in front of his or her ears while sucking.  Signs that your baby has not successfully latched on to  nipple:   Sucking sounds or smacking sounds from your baby while breastfeeding.  Nipple pain. If you think your baby has not latched on correctly, slip your finger into the corner of your baby's mouth to break the suction and place it between your baby's gums. Attempt breastfeeding initiation again. Signs of Successful Breastfeeding Signs from your baby:   A gradual decrease in the number of sucks or complete cessation of sucking.   Falling asleep.   Relaxation of his or her body.   Retention of a small amount of milk in his or her mouth.  Letting go of your breast by himself or herself. Signs from you:  Breasts that have increased in firmness, weight, and size 1-3 hours after feeding.   Breasts that are softer immediately after breastfeeding.  Increased milk volume, as well as a change in milk consistency and color by the fifth day of breastfeeding.   Nipples that are not sore, cracked, or bleeding. Signs That Your Pecola Leisure is Getting Enough Milk  Wetting at least 3 diapers in a 24-hour period. The urine should be clear and pale yellow by age 60 days.  At least 3 stools in a 24-hour period by age 60 days. The stool should be soft and yellow.  At least 3 stools in a 24-hour period by age 169 days. The stool should be seedy and yellow.  No loss of weight greater than 10% of birth weight during the first 2 days of age.  Average weight gain of 4-7 ounces (113-198 g) per week after age 67 days.  Consistent daily weight gain by age 60 days, without weight loss after the age of 2 weeks. After a feeding, your baby may spit up a small amount. This is common. BREASTFEEDING FREQUENCY AND DURATION Frequent feeding will help you make more milk and can prevent sore nipples and breast engorgement. Breastfeed when you feel the need to reduce the fullness of your breasts or when your baby shows signs of hunger. This is called "breastfeeding on demand." Avoid introducing a pacifier to your  baby while you are working to establish breastfeeding (the first 4-6 weeks after your baby is born). After this time you may choose to use a pacifier. Research has shown that pacifier use during the first year of a baby's life decreases the risk of sudden infant death syndrome (SIDS). Allow your baby to feed on each breast as long as he or she wants. Breastfeed until your baby is finished feeding. When your baby unlatches or falls asleep while feeding from the first breast, offer the second breast. Because newborns are often sleepy in the first few weeks of life, you may need to awaken your baby to get him or her to feed. Breastfeeding times will vary from baby to baby. However, the following rules can serve as a guide to help you ensure that your baby is properly fed:  Newborns (babies 76 weeks of age or younger) may breastfeed every 1-3 hours.  Newborns should not go longer than 3 hours during the day or 5 hours during the night without breastfeeding.  You should breastfeed your baby a minimum of 8 times in a 24-hour period until you begin to introduce solid foods to your baby at around 56 months of age. BREAST MILK PUMPING Pumping and storing breast milk allows you to ensure that your baby is exclusively fed your breast milk, even at times when you are unable to breastfeed. This is especially important if you are going back to work while you are still breastfeeding or when you are not able to be present during feedings. Your lactation consultant can give you guidelines on how long it is safe to store breast milk.  A breast pump is a machine that allows you to pump milk from your breast into a sterile bottle. The pumped breast milk can then be stored in a refrigerator or freezer. Some breast pumps are operated by hand, while others use electricity. Ask your lactation consultant which type will work best for you. Breast pumps can be purchased, but some hospitals and breastfeeding  support groups lease  breast pumps on a monthly basis. A lactation consultant can teach you how to hand express breast milk, if you prefer not to use a pump.  CARING FOR YOUR BREASTS WHILE YOU BREASTFEED Nipples can become dry, cracked, and sore while breastfeeding. The following recommendations can help keep your breasts moisturized and healthy:  Avoid using soap on your nipples.   Wear a supportive bra. Although not required, special nursing bras and tank tops are designed to allow access to your breasts for breastfeeding without taking off your entire bra or top. Avoid wearing underwire-style bras or extremely tight bras.  Air dry your nipples for 3-105minutes after each feeding.   Use only cotton bra pads to absorb leaked breast milk. Leaking of breast milk between feedings is normal.   Use lanolin on your nipples after breastfeeding. Lanolin helps to maintain your skin's normal moisture barrier. If you use pure lanolin, you do not need to wash it off before feeding your baby again. Pure lanolin is not toxic to your baby. You may also hand express a few drops of breast milk and gently massage that milk into your nipples and allow the milk to air dry. In the first few weeks after giving birth, some women experience extremely full breasts (engorgement). Engorgement can make your breasts feel heavy, warm, and tender to the touch. Engorgement peaks within 3-5 days after you give birth. The following recommendations can help ease engorgement:  Completely empty your breasts while breastfeeding or pumping. You may want to start by applying warm, moist heat (in the shower or with warm water-soaked hand towels) just before feeding or pumping. This increases circulation and helps the milk flow. If your baby does not completely empty your breasts while breastfeeding, pump any extra milk after he or she is finished.  Wear a snug bra (nursing or regular) or tank top for 1-2 days to signal your body to slightly decrease milk  production.  Apply ice packs to your breasts, unless this is too uncomfortable for you.  Make sure that your baby is latched on and positioned properly while breastfeeding. If engorgement persists after 48 hours of following these recommendations, contact your health care provider or a Advertising copywriter. OVERALL HEALTH CARE RECOMMENDATIONS WHILE BREASTFEEDING  Eat healthy foods. Alternate between meals and snacks, eating 3 of each per day. Because what you eat affects your breast milk, some of the foods may make your baby more irritable than usual. Avoid eating these foods if you are sure that they are negatively affecting your baby.  Drink milk, fruit juice, and water to satisfy your thirst (about 10 glasses a day).   Rest often, relax, and continue to take your prenatal vitamins to prevent fatigue, stress, and anemia.  Continue breast self-awareness checks.  Avoid chewing and smoking tobacco.  Avoid alcohol and drug use. Some medicines that may be harmful to your baby can pass through breast milk. It is important to ask your health care provider before taking any medicine, including all over-the-counter and prescription medicine as well as vitamin and herbal supplements. It is possible to become pregnant while breastfeeding. If birth control is desired, ask your health care provider about options that will be safe for your baby. SEEK MEDICAL CARE IF:   You feel like you want to stop breastfeeding or have become frustrated with breastfeeding.  You have painful breasts or nipples.  Your nipples are cracked or bleeding.  Your breasts are red, tender, or warm.  You have a swollen area on either breast.  You have a fever or chills.  You have nausea or vomiting.  You have drainage other than breast milk from your nipples.  Your breasts do not become full before feedings by the fifth day after you give birth.  You feel sad and depressed.  Your baby is too sleepy to eat  well.  Your baby is having trouble sleeping.   Your baby is wetting less than 3 diapers in a 24-hour period.  Your baby has less than 3 stools in a 24-hour period.  Your baby's skin or the white part of his or her eyes becomes yellow.   Your baby is not gaining weight by 66 days of age. SEEK IMMEDIATE MEDICAL CARE IF:   Your baby is overly tired (lethargic) and does not want to wake up and feed.  Your baby develops an unexplained fever. Document Released: 03/27/2005 Document Revised: 04/01/2013 Document Reviewed: 09/18/2012 [facility] Patient Information 2015 [place], [place]. This information is not intended to replace advice given to you by your health care provider. Make sure you discuss any questions you have with your health care provider.

## 2014-08-30 NOTE — Lactation Note (Signed)
This note was copied from the chart of Melinda Melinda Christian. Lactation Consultation Note  Patient Name: Melinda Christian AVWUJ'WToday's Date: 08/30/2014   Visited with Melinda Christian on day of discharge, baby 2346 hrs old.  Melinda Christian states that she got a lot of great help during the night, and feels that baby is latching much better.  Her nipples are still sore, but latching better.  Sore nipple shells given with instruction on use and care.  Melinda Christian using EBM on nipples following feedings.  Baby cluster fed during the night.  Encouraged continued skin to skin, and cue based feedings.  Engorgement prevention and treatment discussed.  Reminded Melinda Christian of OP lactation services available to her.  Encouraged to call prn.      Judee ClaraSmith, Athira Janowicz E 08/30/2014, 12:22 PM

## 2014-08-30 NOTE — Discharge Summary (Signed)
Obstetric Discharge Summary Reason for Admission: onset of labor Prenatal Procedures: ultrasound Intrapartum Procedures: spontaneous vaginal delivery Postpartum Procedures: none Complications-Operative and Postpartum: RT labial laceration HEMOGLOBIN  Date Value Ref Range Status  08/29/2014 9.7* 12.0 - 15.0 g/dL Final   HCT  Date Value Ref Range Status  08/29/2014 29.0* 36.0 - 46.0 % Final    Physical Exam:  General: alert, cooperative and no distress Lochia: appropriate Uterine Fundus: firm, midline, U-2 Perineum: healing well DVT Evaluation: No evidence of DVT seen on physical exam. Negative Homan's sign. No cords or calf tenderness. No significant calf/ankle edema.  Discharge Diagnoses: Term Pregnancy-delivered  Discharge Information: Date: 08/30/2014 Activity: pelvic rest Diet: routine Medications: PNV, Ibuprofen, Iron and Magnesium Condition: stable Instructions: refer to practice specific booklet Discharge to: home Follow-up Information    Follow up with MODY,VAISHALI R, MD. Schedule an appointment as soon as possible for a visit in 6 weeks.   Specialty:  Obstetrics and Gynecology   Why:  postpartum visit   Contact information:   [facility] [place] [id number] [phone]       Newborn Data: Live born female on 08/28/2014 Birth Weight: 7 lb 8.3 oz (3410 g) APGAR: 8, 9  Home with mother.  Raelyn MoraAWSON, Jozalynn Noyce, M MSN, CNM 08/30/2014, 10:31 AM

## 2015-07-28 ENCOUNTER — Emergency Department (HOSPITAL_BASED_OUTPATIENT_CLINIC_OR_DEPARTMENT_OTHER)
Admission: EM | Admit: 2015-07-28 | Discharge: 2015-07-28 | Disposition: A | Discharge status: Home / Self Care | Payer: Managed Care, Other (non HMO) | Attending: Emergency Medicine | Admitting: Emergency Medicine

## 2015-07-28 ENCOUNTER — Emergency Department (HOSPITAL_BASED_OUTPATIENT_CLINIC_OR_DEPARTMENT_OTHER): Payer: Managed Care, Other (non HMO)

## 2015-07-28 ENCOUNTER — Encounter (HOSPITAL_BASED_OUTPATIENT_CLINIC_OR_DEPARTMENT_OTHER): Payer: Self-pay

## 2015-07-28 DIAGNOSIS — R1030 Lower abdominal pain, unspecified: Secondary | ICD-10-CM | POA: Diagnosis present

## 2015-07-28 DIAGNOSIS — R102 Pelvic and perineal pain: Secondary | ICD-10-CM | POA: Insufficient documentation

## 2015-07-28 DIAGNOSIS — Z3202 Encounter for pregnancy test, result negative: Secondary | ICD-10-CM | POA: Insufficient documentation

## 2015-07-28 LAB — BASIC METABOLIC PANEL
ANION GAP: 6 (ref 5–15)
BUN: 10 mg/dL (ref 6–20)
CALCIUM: 9 mg/dL (ref 8.9–10.3)
CO2: 27 mmol/L (ref 22–32)
CREATININE: 0.62 mg/dL (ref 0.44–1.00)
Chloride: 107 mmol/L (ref 101–111)
GFR calc Af Amer: >60 mL/min (ref >60)
Glucose, Bld: 99 mg/dL (ref 65–99)
Potassium: 4 mmol/L (ref 3.5–5.1)
SODIUM: 140 mmol/L (ref 135–145)

## 2015-07-28 LAB — CBC WITH DIFFERENTIAL/PLATELET
Basophils Absolute: 0 10*3/uL (ref 0.0–0.1)
Basophils Relative: 0 %
EOS ABS: 0.1 10*3/uL (ref 0.0–0.7)
Eosinophils Relative: 2 %
HEMATOCRIT: 36.1 % (ref 36.0–46.0)
HEMOGLOBIN: 12.6 g/dL (ref 12.0–15.0)
Lymphocytes Relative: 31 %
Lymphs Abs: 1.9 10*3/uL (ref 0.7–4.0)
MCH: 30.1 pg (ref 26.0–34.0)
MCHC: 34.9 g/dL (ref 30.0–36.0)
MCV: 86.2 fL (ref 78.0–100.0)
MONO ABS: 0.4 10*3/uL (ref 0.1–1.0)
MONOS PCT: 7 %
NEUTROS ABS: 3.6 10*3/uL (ref 1.7–7.7)
NEUTROS PCT: 60 %
Platelets: 210 10*3/uL (ref 150–400)
RBC: 4.19 MIL/uL (ref 3.87–5.11)
RDW: 14.5 % (ref 11.5–15.5)
WBC: 6.1 10*3/uL (ref 4.0–10.5)

## 2015-07-28 LAB — URINALYSIS, ROUTINE W REFLEX MICROSCOPIC
Bilirubin Urine: NEGATIVE
GLUCOSE, UA: NEGATIVE mg/dL
Hgb urine dipstick: NEGATIVE
Ketones, ur: NEGATIVE mg/dL
LEUKOCYTES UA: NEGATIVE
Nitrite: NEGATIVE
Protein, ur: NEGATIVE mg/dL
Specific Gravity, Urine: 1.002 — ABNORMAL LOW (ref 1.005–1.030)
pH: 6.5 (ref 5.0–8.0)

## 2015-07-28 LAB — WET PREP, GENITAL
CLUE CELLS WET PREP: NONE SEEN
Sperm: NONE SEEN
TRICH WET PREP: NONE SEEN
YEAST WET PREP: NONE SEEN

## 2015-07-28 LAB — PREGNANCY, URINE: PREG TEST UR: NEGATIVE

## 2015-07-28 MED ORDER — SODIUM CHLORIDE 0.9 % IV SOLN
INTRAVENOUS | Status: DC
  Administered 2015-07-28: 17:00:00 via INTRAVENOUS

## 2015-07-28 MED ORDER — KETOROLAC TROMETHAMINE 30 MG/ML IJ SOLN
30.0000 mg | Freq: Once | INTRAMUSCULAR | Status: AC
  Administered 2015-07-28: 30 mg via INTRAVENOUS
  Filled 2015-07-28: qty 1

## 2015-07-28 MED ORDER — NAPROXEN 500 MG PO TABS: 500.0000 mg | ORAL_TABLET | Freq: Two times a day (BID) | ORAL | Status: DC

## 2015-07-28 MED ORDER — SODIUM CHLORIDE 0.9 % IV BOLUS (SEPSIS)
500.0000 mL | Freq: Once | INTRAVENOUS | Status: AC
  Administered 2015-07-28: 500 mL via INTRAVENOUS

## 2015-07-28 MED ORDER — HYDROCODONE-ACETAMINOPHEN 5-325 MG PO TABS: 1.0000 | ORAL_TABLET | Freq: Four times a day (QID) | ORAL | Status: DC | PRN

## 2015-07-28 MED ORDER — ONDANSETRON HCL 4 MG/2ML IJ SOLN
4.0000 mg | Freq: Once | INTRAMUSCULAR | Status: AC
  Administered 2015-07-28: 4 mg via INTRAVENOUS
  Filled 2015-07-28: qty 2

## 2015-07-28 MED ORDER — IOPAMIDOL (ISOVUE-300) INJECTION 61%
100.0000 mL | Freq: Once | INTRAVENOUS | Status: AC | PRN
  Administered 2015-07-28: 100 mL via INTRAVENOUS

## 2015-07-28 NOTE — ED Notes (Signed)
Pt given d/c instructions. Rx x 2. Verbalizes understanding. No questions. 

## 2015-07-28 NOTE — Discharge Instructions (Signed)
Today's workup without any obvious findings. Recommend follow-up with your OB/GYN doctor for further evaluation. Take the Naprosyn on a regular basis. Supplement with hydrocodone as needed for additional pain relief.

## 2015-07-28 NOTE — ED Provider Notes (Addendum)
CSN: [id number]     Arrival date & time 07/28/15  1534 History   First MD Initiated Contact with Patient 07/28/15 1615     Chief Complaint  Patient presents with  . Groin Pain     (Consider location/radiation/quality/duration/timing/severity/associated sxs/prior Treatment) Patient is a 40 y.o. female presenting with groin pain. The history is provided by the patient.  Groin Pain Pertinent negatives include no chest pain, no abdominal pain, no headaches and no shortness of breath.  Patient with a history of vaginal pain for 2 weeks. Patient tried some hormone cream as well yeast treatment without any improvement. Pain is dull in nature and persisted. Seems to be on the right side of the vaginal area. No bleeding. No fevers. No significant discharge.  Past Medical History  Diagnosis Date  . No pertinent past medical history   . Wrist pain     left  . Medical history non-contributory   . Postpartum care following vaginal delivery (5/20) 08/29/2014   Past Surgical History  Procedure Laterality Date  . Wisdom tooth extraction    . Knee arthroscopy    . Shoulder arthroscopy    . Dorsal compartment release Left 06/11/2013    Procedure: LEFT RELEASE DORSAL COMPARTMENT (DEQUERVAIN);  Surgeon: Nestor Lewandowsky, MD;  Location: [facility];  Service: Orthopedics;  Laterality: Left;  . Wrist surgery     Family History  Problem Relation Age of Onset  . Other Neg Hx    Social History  Substance Use Topics  . Smoking status: Never Smoker   . Smokeless tobacco: Never Used  . Alcohol Use: Yes     Comment: social   OB History    Gravida Para Term Preterm AB TAB SAB Ectopic Multiple Living   0 2     Review of Systems  Constitutional: Negative for fever.  HENT: Negative for congestion.   Eyes: Negative for visual disturbance.  Respiratory: Negative for shortness of breath.   Cardiovascular: Negative for chest pain.  Gastrointestinal: Negative for nausea, vomiting  and abdominal pain.  Genitourinary: Positive for vaginal pain and pelvic pain. Negative for dysuria, vaginal bleeding and vaginal discharge.  Musculoskeletal: Negative for back pain.  Neurological: Negative for headaches.  Hematological: Does not bruise/bleed easily.  Psychiatric/Behavioral: Negative for confusion.      Allergies  Review of patient's allergies indicates no known allergies.  Home Medications   Prior to Admission medications   Medication Sig Start Date End Date Taking? Authorizing Provider  HYDROcodone-acetaminophen (NORCO/VICODIN) 5-325 MG tablet Take 1-2 tablets by mouth every 6 (six) hours as needed for moderate pain. 07/28/15   Vanetta Mulders, MD  naproxen (NAPROSYN) 500 MG tablet Take 1 tablet (500 mg total) by mouth 2 (two) times daily. 07/28/15   Vanetta Mulders, MD   BP 121/80 mmHg  Pulse 65  Temp(Src) 98.5 F (36.9 C) (Oral)  Resp 18  Ht  (1.803 m)  Wt 64.864 kg  BMI 19.95 kg/m2  SpO2 100%  LMP 07/04/2015 Physical Exam  Constitutional: She is oriented to person, place, and time. She appears well-developed and well-nourished. No distress.  HENT:  Head: Normocephalic and atraumatic.  Eyes: Conjunctivae and EOM are normal. Pupils are equal, round, and reactive to light.  Neck: Normal range of motion.  Cardiovascular: Normal rate and normal heart sounds.   Pulmonary/Chest: Effort normal and breath sounds normal. No respiratory distress.  Abdominal: Soft. Bowel sounds are normal. There is  no tenderness.  Genitourinary: Uterus normal. No vaginal discharge found.  Patient tender to the right side of the vaginal area. Slight bloody discharge from the cervix more like a friable cervix. No palpable mass. But tender kind of where the Bartholin's gland cyst would be but no erythema. No obvious rectal mass. No cervical motion tenderness no uterine tenderness no adnexal tenderness.  Musculoskeletal: Normal range of motion. She exhibits no edema.  Neurological:  She is alert and oriented to person, place, and time. No cranial nerve deficit. She exhibits normal muscle tone. Coordination normal.  Skin: Skin is warm. No rash noted.  Nursing note and vitals reviewed.   ED Course  Procedures (including critical care time) Labs Review Labs Reviewed  WET PREP, GENITAL - Abnormal; Notable for the following:    WBC, Wet Prep HPF POC FEW (*)    All other components within normal limits  URINALYSIS, ROUTINE W REFLEX MICROSCOPIC (NOT AT [facility]) - Abnormal; Notable for the following:    Specific Gravity, Urine 1.002 (*)    All other components within normal limits  PREGNANCY, URINE  CBC WITH DIFFERENTIAL/PLATELET  BASIC METABOLIC PANEL  HIV ANTIBODY (ROUTINE TESTING)  GC/CHLAMYDIA PROBE AMP ([place]) NOT AT [facility]   Results for orders placed or performed during the hospital encounter of 07/28/15  Wet prep, genital  Result Value Ref Range   Yeast Wet Prep HPF POC NONE SEEN NONE SEEN   Trich, Wet Prep NONE SEEN NONE SEEN   Clue Cells Wet Prep HPF POC NONE SEEN NONE SEEN   WBC, Wet Prep HPF POC FEW (A) NONE SEEN   Sperm NONE SEEN   Urinalysis, Routine w reflex microscopic (not at [facility])  Result Value Ref Range   Color, Urine YELLOW YELLOW   APPearance CLEAR CLEAR   Specific Gravity, Urine 1.002 (L) 1.005 - 1.030   pH 6.5 5.0 - 8.0   Glucose, UA NEGATIVE NEGATIVE mg/dL   Hgb urine dipstick NEGATIVE NEGATIVE   Bilirubin Urine NEGATIVE NEGATIVE   Ketones, ur NEGATIVE NEGATIVE mg/dL   Protein, ur NEGATIVE NEGATIVE mg/dL   Nitrite NEGATIVE NEGATIVE   Leukocytes, UA NEGATIVE NEGATIVE  Pregnancy, urine  Result Value Ref Range   Preg Test, Ur NEGATIVE NEGATIVE  CBC with Differential/Platelet  Result Value Ref Range   WBC 6.1 4.0 - 10.5 K/uL   RBC 4.19 3.87 - 5.11 MIL/uL   Hemoglobin 12.6 12.0 - 15.0 g/dL   HCT 41.336.1 24.436.0 - 01.046.0 %   MCV 86.2 78.0 - 100.0 fL   MCH 30.1 26.0 - 34.0 pg   MCHC 34.9 30.0 - 36.0 g/dL   RDW 27.214.5 53.611.5 - 64.415.5 %    Platelets 210 150 - 400 K/uL   Neutrophils Relative % 60 %   Neutro Abs 3.6 1.7 - 7.7 K/uL   Lymphocytes Relative 31 %   Lymphs Abs 1.9 0.7 - 4.0 K/uL   Monocytes Relative 7 %   Monocytes Absolute 0.4 0.1 - 1.0 K/uL   Eosinophils Relative 2 %   Eosinophils Absolute 0.1 0.0 - 0.7 K/uL   Basophils Relative 0 %   Basophils Absolute 0.0 0.0 - 0.1 K/uL  Basic metabolic panel  Result Value Ref Range   Sodium 140 135 - 145 mmol/L   Potassium 4.0 3.5 - 5.1 mmol/L   Chloride 107 101 - 111 mmol/L   CO2 27 22 - 32 mmol/L   Glucose, Bld 99 65 - 99 mg/dL   BUN 10 6 - 20  mg/dL   Creatinine, Ser 1.61 0.44 - 1.00 mg/dL   Calcium 9.0 8.9 - 09.6 mg/dL   GFR calc non Af Amer >60 >60 mL/min   GFR calc Af Amer >60 >60 mL/min   Anion gap 6 5 - 15     Imaging Review Ct Abdomen Pelvis W Contrast  07/28/2015  CLINICAL DATA:  Pelvic pain for 2 weeks. EXAM: CT ABDOMEN AND PELVIS WITH CONTRAST TECHNIQUE: Multidetector CT imaging of the abdomen and pelvis was performed using the standard protocol following bolus administration of intravenous contrast. CONTRAST:  ISOVUE-300 IOPAMIDOL (ISOVUE-300) INJECTION 61% COMPARISON:  None. FINDINGS: Lower chest: The lung bases are clear of acute process. No pleural effusion or pulmonary lesions. The heart is normal in size. No pericardial effusion. The distal esophagus and aorta are unremarkable. Hepatobiliary: No focal hepatic lesions or intrahepatic biliary dilatation. The gallbladder is normal. No common bile duct dilatation. Pancreas: No mass, inflammation or ductal dilatation. Spleen: Normal size.  No focal lesions. Adrenals/Urinary Tract: The adrenal glands are normal. Both kidneys are normal. No renal, ureteral or bladder calculi. Stomach/Bowel: The stomach, duodenum, small bowel and colon unremarkable. No inflammatory changes, mass lesions or obstructive findings. Moderate stool throughout the colon and down into the rectum could suggest constipation. The terminal  ileum and appendix are normal. Vascular/Lymphatic: The aorta is normal in caliber. No dissection. The branch vessels are patent. The major venous structures are patent. No mesenteric or retroperitoneal mass or adenopathy. Small scattered lymph nodes are noted. Reproductive: The uterus and ovaries are unremarkable. Slightly prominent endometrium is likely due to secretory phase of ovulation. Other: No pelvic mass or adenopathy. No free pelvic fluid collections. No inguinal mass or adenopathy. No inguinal mass or adenopathy. Musculoskeletal: No significant bony findings. IMPRESSION: 1. No acute abdominal/pelvic findings, mass lesions or lymphadenopathy. 2. Normal uterus and ovaries. 3. Moderate stool throughout the colon could suggest constipation. Bold Electronically Signed   By: Rudie Meyer M.D.   On: 07/28/2015 19:15   I have personally reviewed and evaluated these images and labs as part of my medical decision-making.   EKG Interpretation None      MDM   Final diagnoses:  Pelvic pain in female   Extensive work up.  CT scan of the area without evidence of any abscess rectal or perirectal. Examination no low tender around the Bartholin gland cyst area on the right side without any erythema no induration chest tenderness. Friable cervix. But no evidence of any cervical motion tenderness or anything consistent with pelvic inflammatory disease. Urinalysis negative no leukocytosis. Please see test negative. Cultures pending. Will referred to her GYN for further evaluation.   Vanetta Mulders, MD 07/28/15   Vanetta Mulders, MD 07/28/15 [phone]

## 2015-07-28 NOTE — ED Notes (Signed)
Pt describes vaginal pain x 2 weeks "similar to when I had my children". Has tried hormone cream, meds for yeast inf and OTC meds without relief. Pain is dull and nagging at times and sharp enough to awaken her from sleep at other times.

## 2015-07-28 NOTE — ED Notes (Signed)
Patient transported to CT 

## 2015-07-28 NOTE — ED Notes (Signed)
MD at bedside. 

## 2015-07-28 NOTE — ED Notes (Signed)
C/o vaginal pain x 2 weeks-denies d/c-slow steady gait

## 2015-07-29 LAB — GC/CHLAMYDIA PROBE AMP (~~LOC~~) NOT AT ARMC
CHLAMYDIA, DNA PROBE: NEGATIVE
Neisseria Gonorrhea: NEGATIVE

## 2015-07-29 LAB — HIV ANTIBODY (ROUTINE TESTING W REFLEX): HIV Screen 4th Generation wRfx: NONREACTIVE

## 2016-03-05 IMAGING — US US TRANSVAGINAL NON-OB
1 series · 14 of 25 positions shown · non-contrast
Comparison: None

CLINICAL DATA: Current spontaneous abortion, continued bleeding
evaluate for retained products of conception



[Series 1: us ob comp less 14 wks · 14 of 38 slices shown]
[im 1/38]
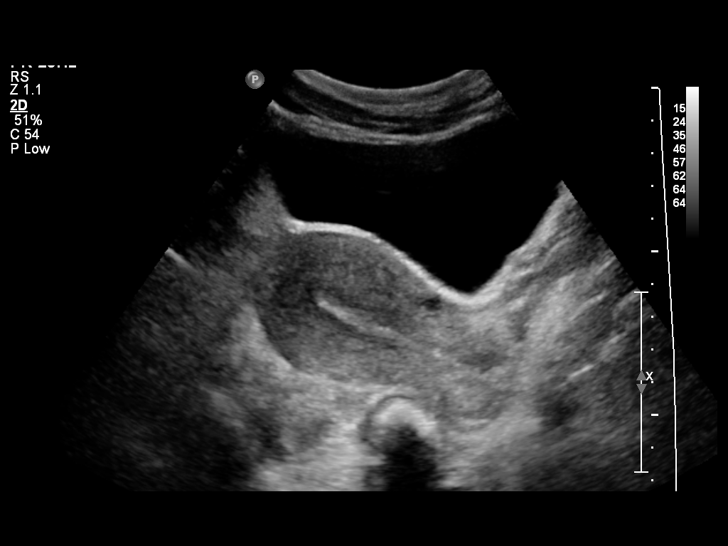
[im 4/38]
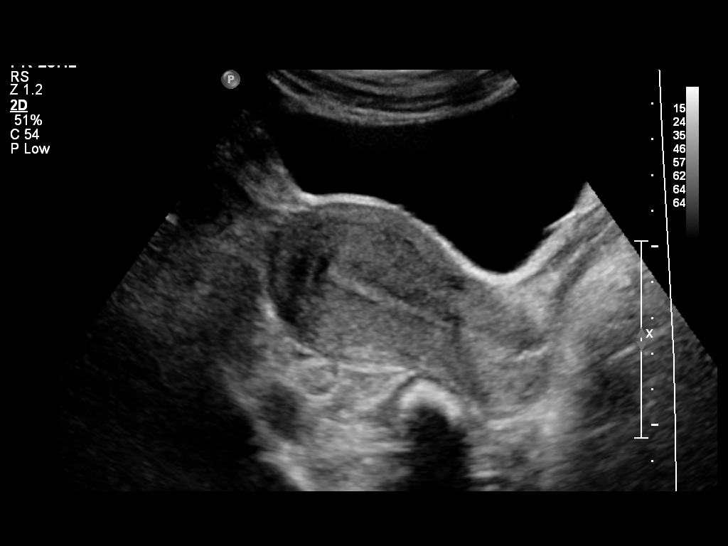
[im 7/38]
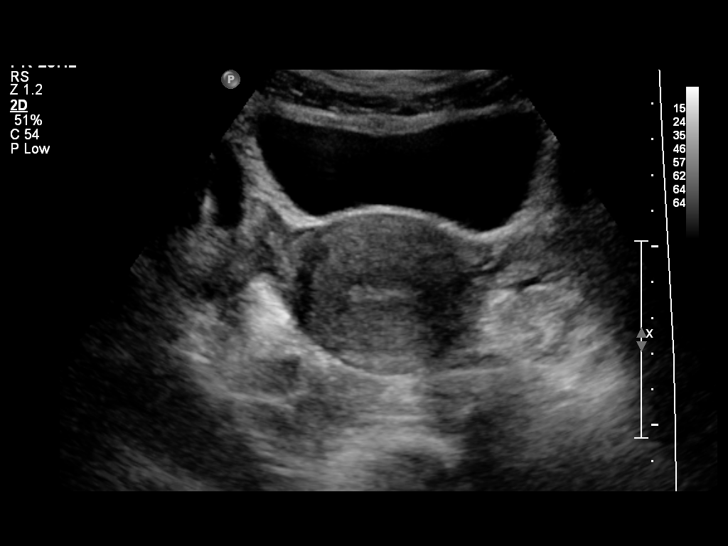
[im 10/38]
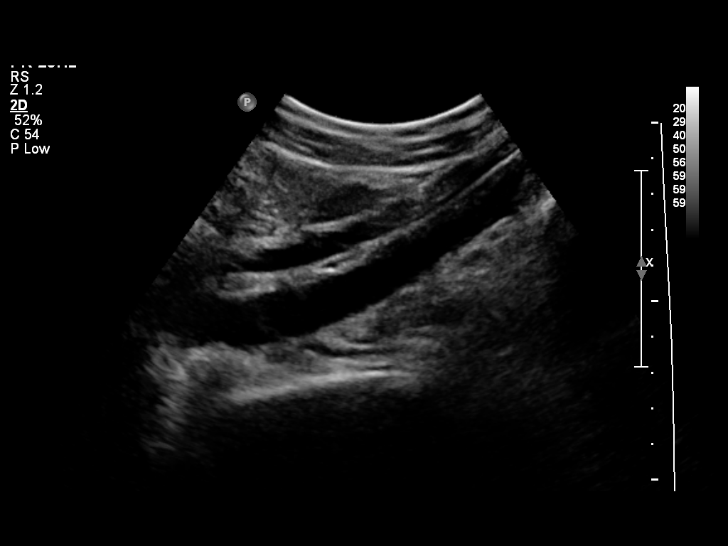
[im 13/38]
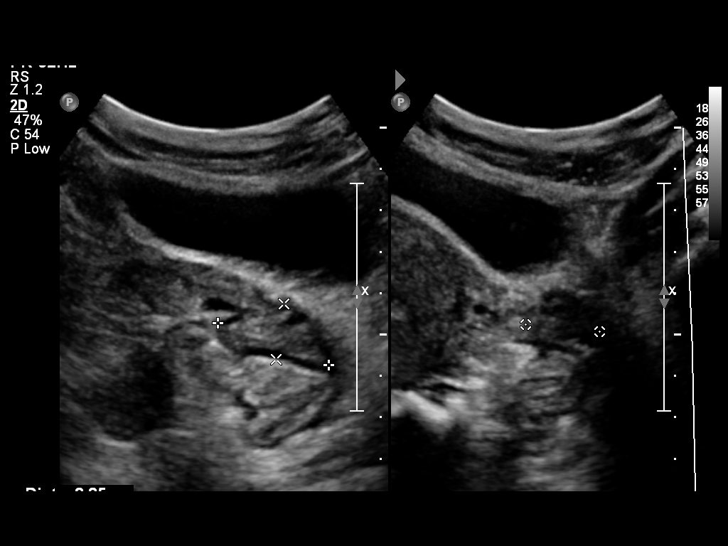
[im 14/38]
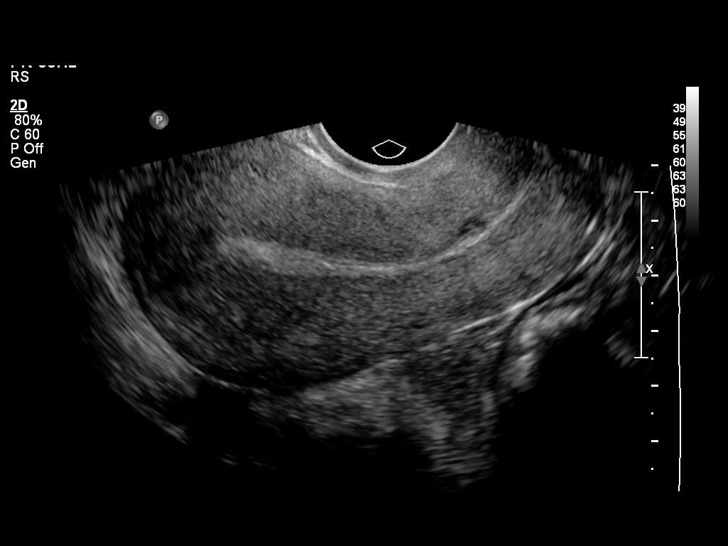
[im 17/38]
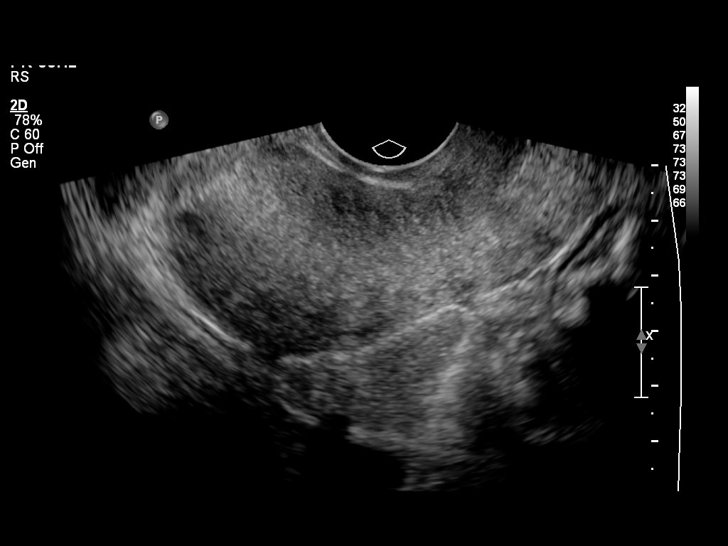
[im 21/38]
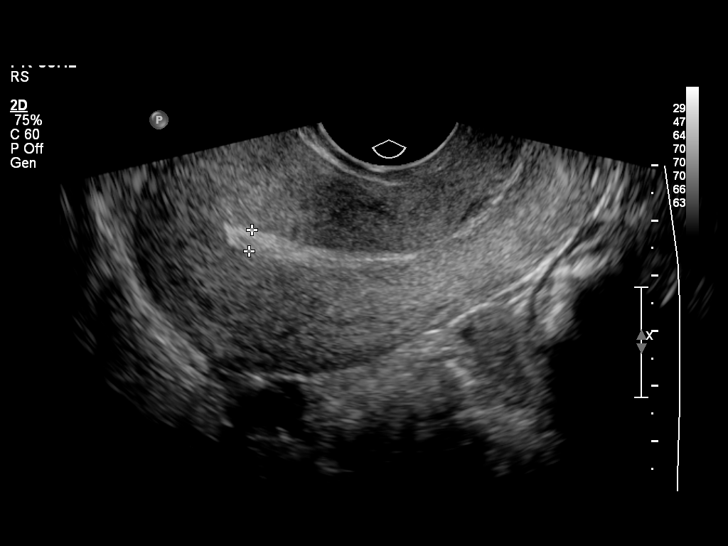
[im 24/38]
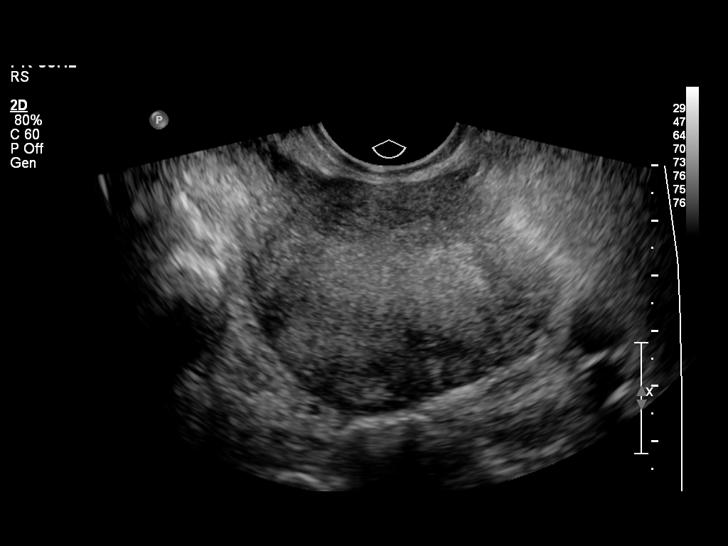
[im 25/38]
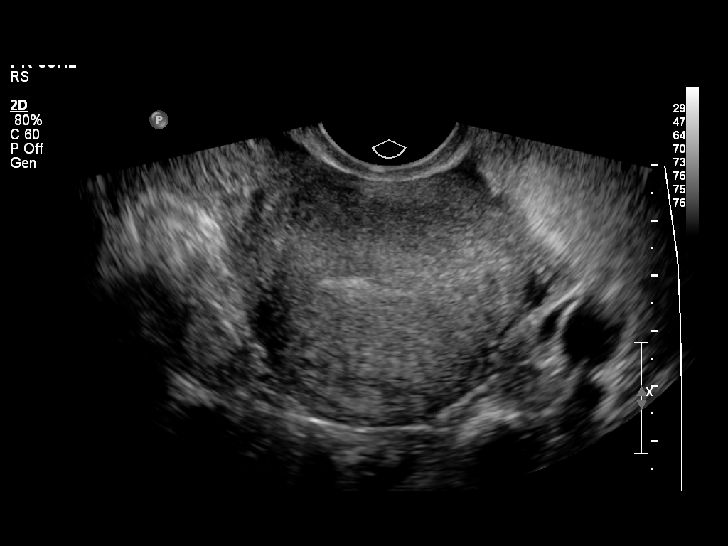
[im 28/38]
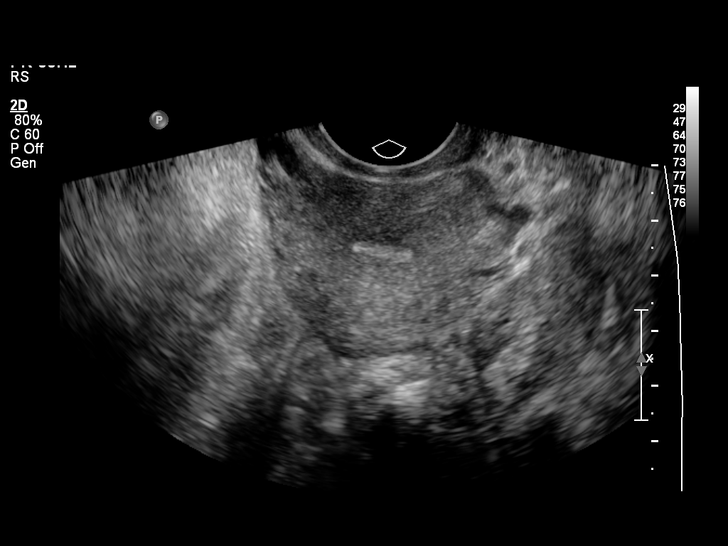
[im 31/38]
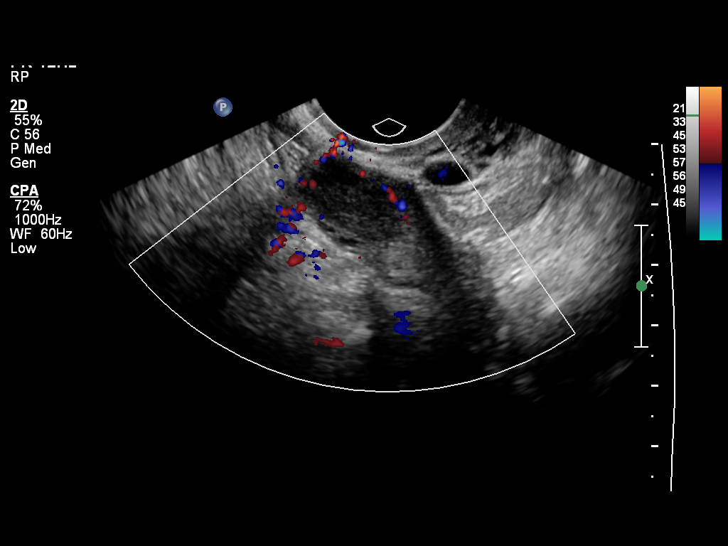
[im 34/38]
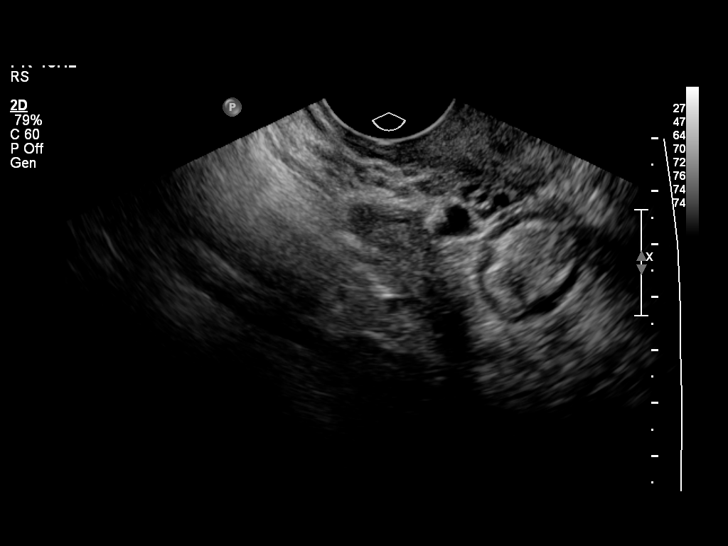
[im 38/38]
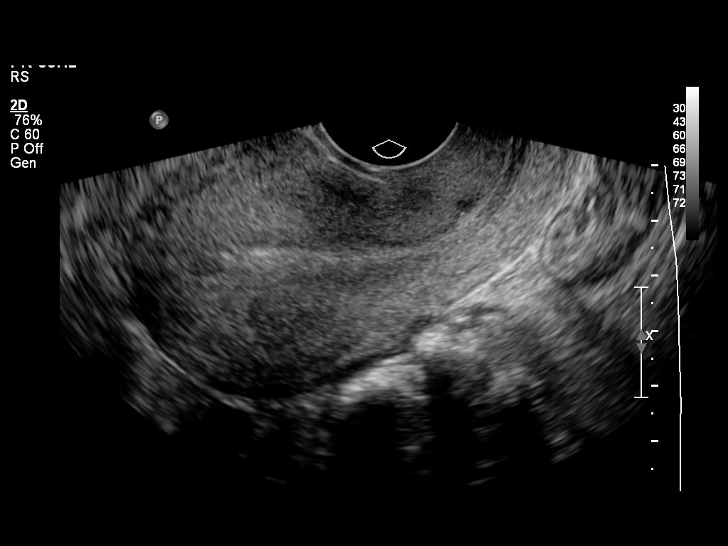

[14 of 25 positions shown; findings below may reference images not displayed]

FINDINGS: Uterus

Measurements: 8.5 x 4.7 x 5.4 cm. No fibroids or other mass
visualized.

Endometrium

Thickness: 4 mm. No focal abnormality visualized. No associated
vascularity/color Doppler flow.

Right ovary

Measurements: 3.7 x 1.5 x 2.5 cm. Normal appearance/no adnexal mass.

Left ovary

Measurements: 2.7 x 1.3 x 1.2 cm. Normal appearance/no adnexal mass.

Other findings

No free fluid.
IMPRESSION: Negative pelvic ultrasound.

Specifically, no findings suspicious for retained products of
conception.

## 2017-12-20 IMAGING — CT CT ABD-PELV W/ CM
2 of 4 series · 16 of 46 positions shown, 18 images · IV contrast (APPLIED)
Comparison: None.

CLINICAL DATA: Pelvic pain for 2 weeks.

EXAM:
CT ABDOMEN AND PELVIS WITH CONTRAST
TECHNIQUE: Multidetector CT imaging of the abdomen and pelvis was performed
using the standard protocol following bolus administration of
intravenous contrast.
CONTRAST:  100mL [id number] IOPAMIDOL (RD4NIW-E44) INJECTION 61%

[Series 2: axial st · axial · 0.90mm/px · z∈[-494,-94]mm · 13 of 88 slices shown, 15 images]
[im 4/88  soft-tissue]
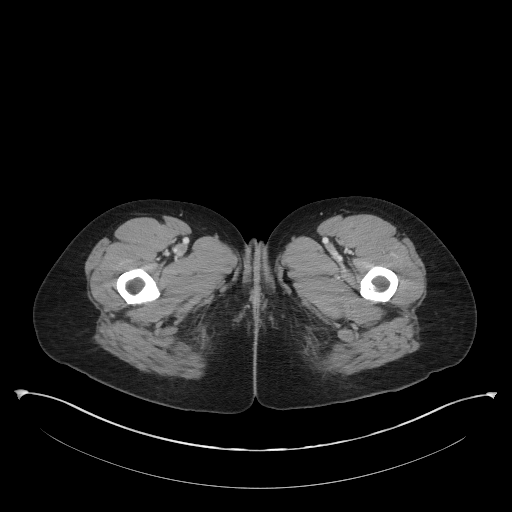
[im 4/88  bone]
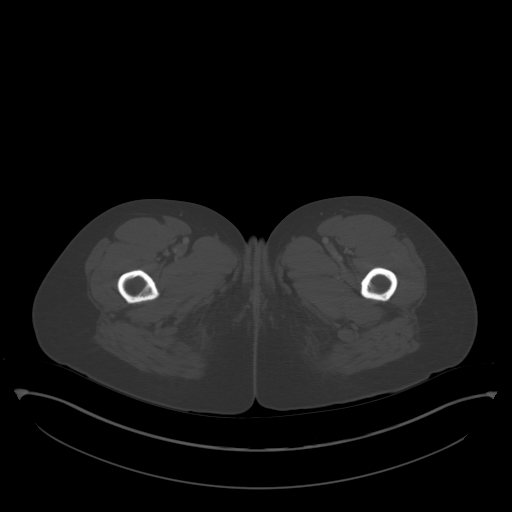
[im 11/88  soft-tissue]
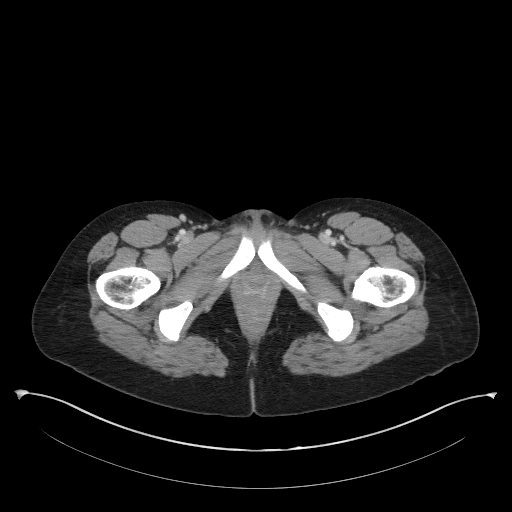
[im 17/88  soft-tissue]
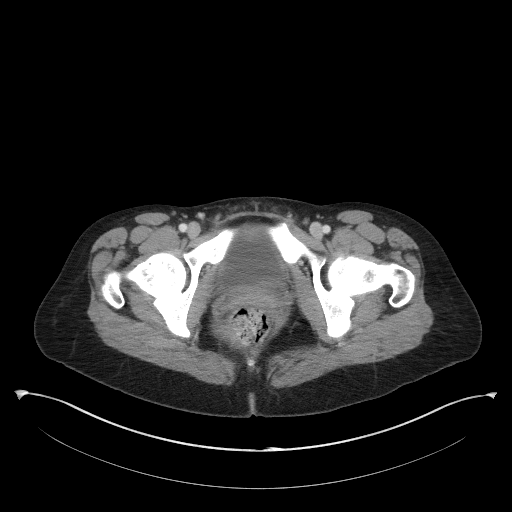
[im 24/88  soft-tissue]
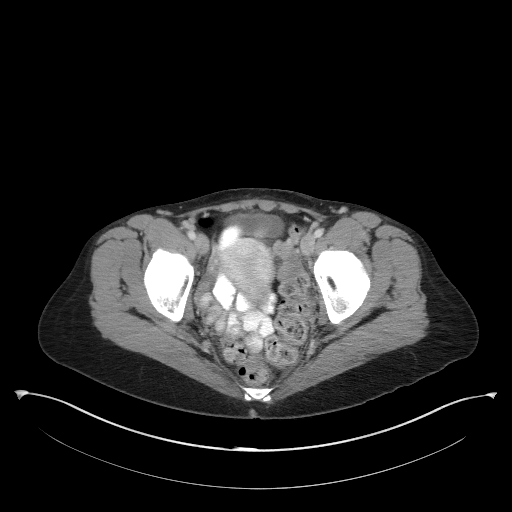
[im 31/88  soft-tissue]
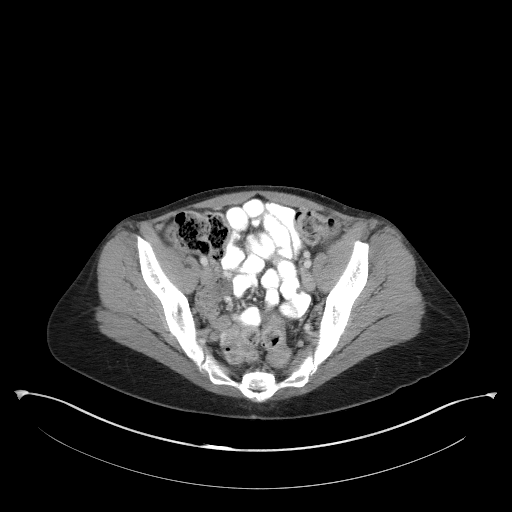
[im 37/88  soft-tissue]
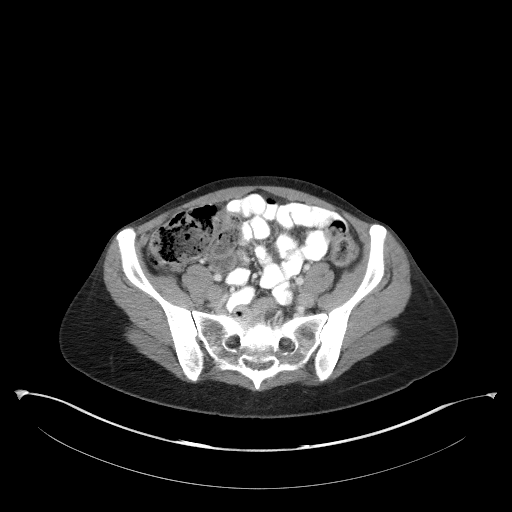
[im 44/88  soft-tissue]
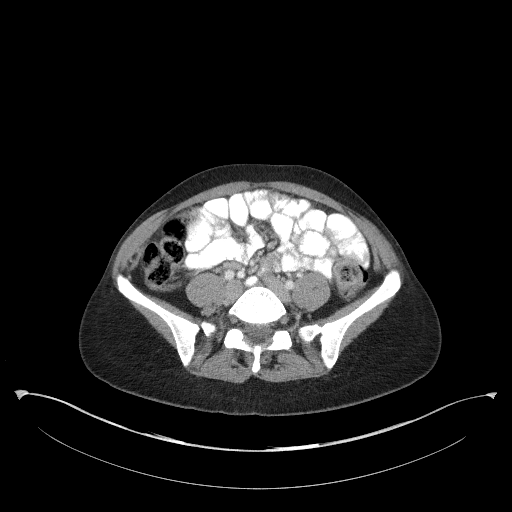
[im 51/88  soft-tissue]
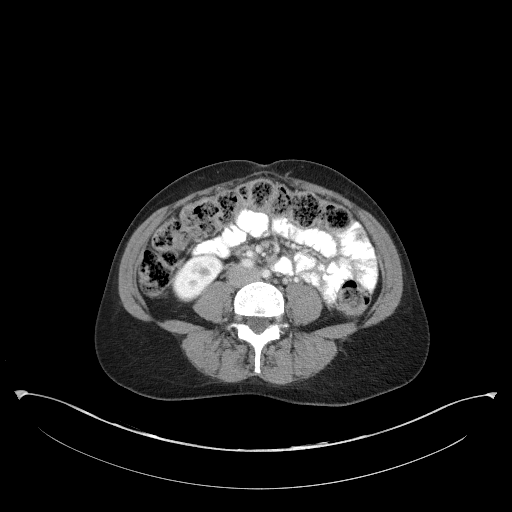
[im 57/88  soft-tissue]
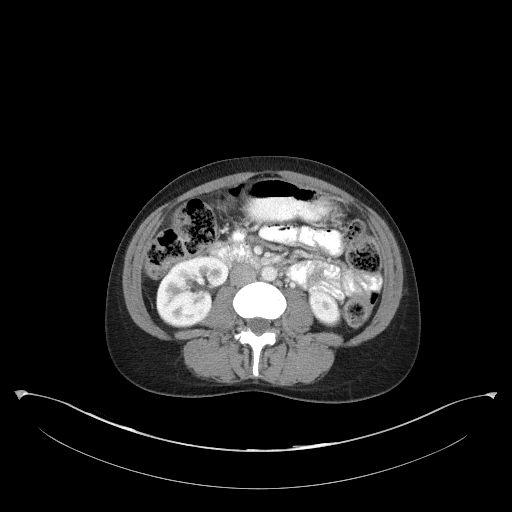
[im 57/88  bone]
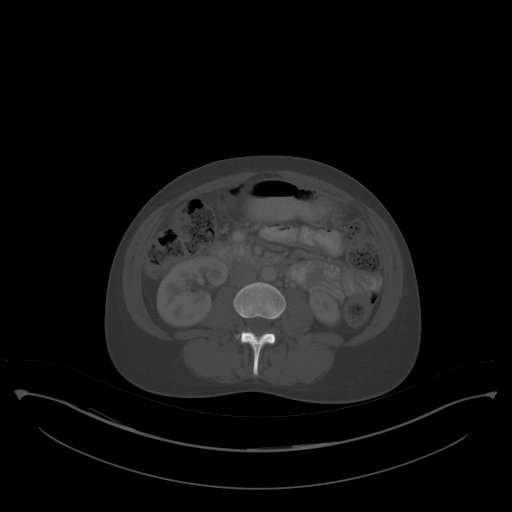
[im 64/88  soft-tissue]
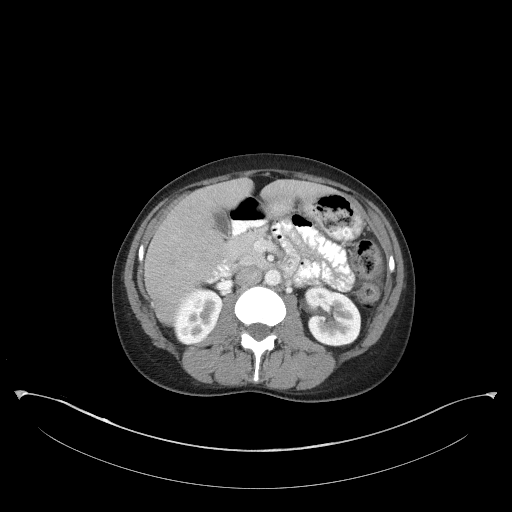
[im 71/88  soft-tissue]
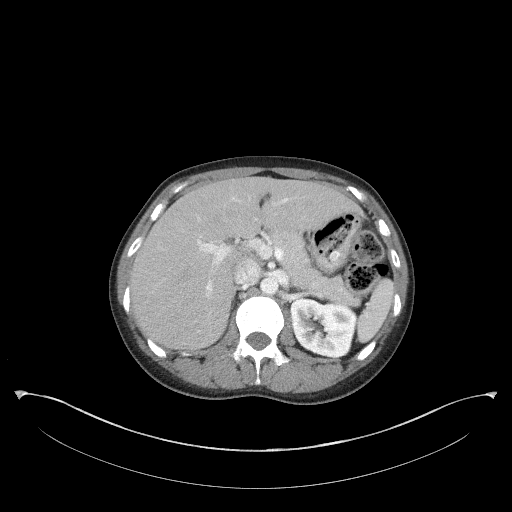
[im 77/88  soft-tissue]
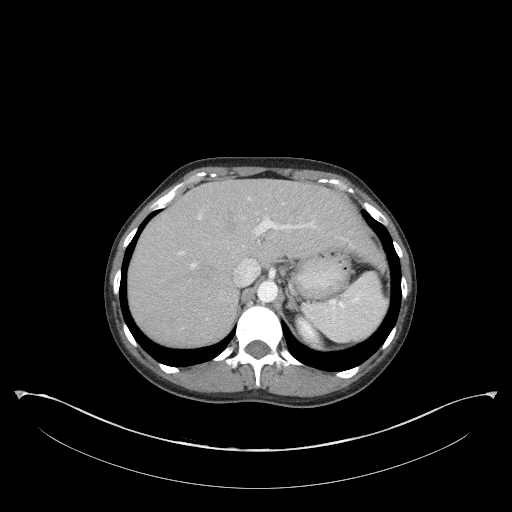
[im 84/88  soft-tissue]
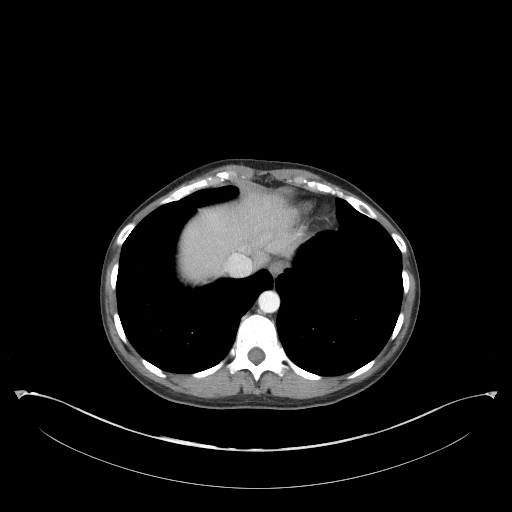

[Series 5: coronal st · coronal · 0.75mm/px · 3 of 74 slices shown]
[im 25/74  soft-tissue]
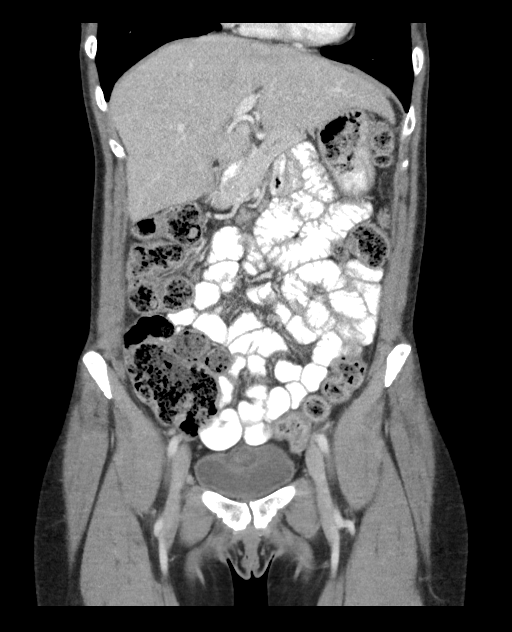
[im 33/74  soft-tissue]
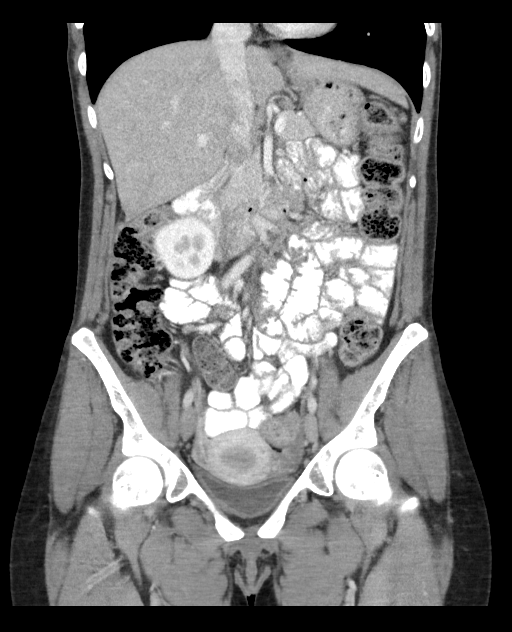
[im 41/74  soft-tissue]
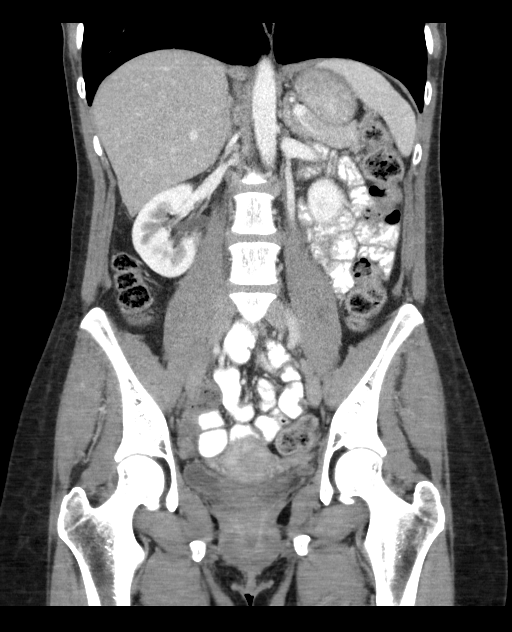

[16 of 46 positions shown; findings below may reference images not displayed]

FINDINGS: Lower chest: The lung bases are clear of acute process. No pleural
effusion or pulmonary lesions. The heart is normal in size. No
pericardial effusion. The distal esophagus and aorta are
unremarkable.

Hepatobiliary: No focal hepatic lesions or intrahepatic biliary
dilatation. The gallbladder is normal. No common bile duct
dilatation.

Pancreas: No mass, inflammation or ductal dilatation.

Spleen: Normal size.  No focal lesions.

Adrenals/Urinary Tract: The adrenal glands are normal. Both kidneys
are normal. No renal, ureteral or bladder calculi.

Stomach/Bowel: The stomach, duodenum, small bowel and colon
unremarkable. No inflammatory changes, mass lesions or obstructive
findings. Moderate stool throughout the colon and down into the
rectum could suggest constipation. The terminal ileum and appendix
are normal.

Vascular/Lymphatic: The aorta is normal in caliber. No dissection.
The branch vessels are patent. The major venous structures are
patent. No mesenteric or retroperitoneal mass or adenopathy. Small
scattered lymph nodes are noted.

Reproductive: The uterus and ovaries are unremarkable. Slightly
prominent endometrium is likely due to secretory phase of ovulation.

Other: No pelvic mass or adenopathy. No free pelvic fluid
collections. No inguinal mass or adenopathy. No inguinal mass or
adenopathy.

Musculoskeletal: No significant bony findings.
IMPRESSION: 1. No acute abdominal/pelvic findings, mass lesions or
lymphadenopathy.
2. Normal uterus and ovaries.
3. Moderate stool throughout the colon could suggest constipation.
Bold

## 2020-10-15 ENCOUNTER — Other Ambulatory Visit: Payer: Self-pay

## 2020-10-15 ENCOUNTER — Ambulatory Visit
Admission: EM | Admit: 2020-10-15 | Discharge: 2020-10-15 | Disposition: A | Discharge status: Home / Self Care | Payer: 59 | Attending: Emergency Medicine | Admitting: Emergency Medicine

## 2020-10-15 DIAGNOSIS — J0100 Acute maxillary sinusitis, unspecified: Secondary | ICD-10-CM

## 2020-10-15 MED ORDER — FLUTICASONE PROPIONATE 50 MCG/ACT NA SUSP: 1.0000 | Freq: Every day | NASAL | 0 refills | Status: DC

## 2020-10-15 MED ORDER — GUAIFENESIN-CODEINE 100-10 MG/5ML PO SOLN: 5.0000 mL | Freq: Three times a day (TID) | ORAL | 0 refills | Status: DC | PRN

## 2020-10-15 MED ORDER — AMOXICILLIN-POT CLAVULANATE 875-125 MG PO TABS: 1.0000 | ORAL_TABLET | Freq: Two times a day (BID) | ORAL | 0 refills | Status: DC

## 2020-10-15 NOTE — Discharge Instructions (Addendum)
Begin Augmentin twice daily for 1 week to cover sinus infection Flonase nasal spray 1 to 2 spray in each nostril daily Continue rest and fluids Robitussin with codeine-will cause drowsiness, do not drive or work after taking Please follow-up with nonimprovement or worsening

## 2020-10-15 NOTE — ED Triage Notes (Signed)
6 day h/o cough, sore throat, congestion and HA that has gradually worsened since the onset. Cough is interfering with her sleep. C/o bilateral ear fullness. Denies abdominal pain, n/v/d.  Has been taking tessalon, DayQuil and ibuprofen without relief.  Two negative at home covid test.

## 2020-10-15 NOTE — ED Provider Notes (Signed)
Melinda Christian    CSN: [id number] Arrival date & time: 10/15/20  1111      History   Chief Complaint Chief Complaint  Patient presents with   Cough   Headache    HPI Melinda Christian is a 45 y.o. female presenting today for evaluation of possible sinus infection and cough.  Reports 1 week of sinus congestion, pressure and postnasal drainage with associated cough.  Denies associated chest pain shortness of breath or wheezing.  Denies fevers.  Reports do at home COVID test which with a negative.  Has tried DayQuil, Tessalon without for relief of symptoms.  Reports poor sleep due to cough.  HPI  Past Medical History:  Diagnosis Date   Medical history non-contributory    No pertinent past medical history    Postpartum Christian following vaginal delivery (5/20) 08/29/2014   Wrist pain    left    Patient Active Problem List   Diagnosis Date Noted   Postpartum Christian following vaginal delivery (5/20) 08/29/2014   Active labor 08/28/2014   Abnormal chromosomal and genetic finding on antenatal screening of mother 03/04/2014   SVD (spontaneous vaginal delivery) w/ 3rd degree LAC 08/30/2012    Past Surgical History:  Procedure Laterality Date   DORSAL COMPARTMENT RELEASE Left 06/11/2013   Procedure: LEFT RELEASE DORSAL COMPARTMENT (DEQUERVAIN);  Surgeon: Nestor Lewandowsky, MD;  Location: [facility];  Service: Orthopedics;  Laterality: Left;   KNEE ARTHROSCOPY     SHOULDER ARTHROSCOPY     WISDOM TOOTH EXTRACTION     WRIST SURGERY      OB History     Gravida  4   Para  2   Term  2   Preterm      AB  2   Living  2      SAB  2   IAB      Ectopic      Multiple  0   Live Births  2            Home Medications    Prior to Admission medications   Medication Sig Start Date End Date Taking? Authorizing Provider  amoxicillin-clavulanate (AUGMENTIN) 875-125 MG tablet Take 1 tablet by mouth every 12 (twelve) hours. 10/15/20  Yes Vila Dory C,  PA-C  fluticasone (FLONASE) 50 MCG/ACT nasal spray Place 1-2 sprays into both nostrils daily. 10/15/20  Yes Brandye Inthavong C, PA-C  guaiFENesin-codeine 100-10 MG/5ML syrup Take 5-10 mLs by mouth 3 (three) times daily as needed for cough. 10/15/20  Yes Shamina Etheridge, Junius Creamer, PA-C    Family History Family History  Problem Relation Age of Onset   Other Neg Hx     Social History Social History   Tobacco Use   Smoking status: Never   Smokeless tobacco: Never  Substance Use Topics   Alcohol use: Yes    Comment: social   Drug use: No     Allergies   Patient has no known allergies.   Review of Systems Review of Systems  Constitutional:  Negative for activity change, appetite change, chills, fatigue and fever.  HENT:  Positive for congestion, rhinorrhea, sinus pressure and sore throat. Negative for ear pain and trouble swallowing.   Eyes:  Negative for discharge and redness.  Respiratory:  Positive for cough. Negative for chest tightness and shortness of breath.   Cardiovascular:  Negative for chest pain.  Gastrointestinal:  Negative for abdominal pain, diarrhea, nausea and vomiting.  Musculoskeletal:  Negative for myalgias.  Skin:  Negative for rash.  Neurological:  Negative for dizziness, light-headedness and headaches.    Physical Exam Triage Vital Signs ED Triage Vitals  Enc Vitals Group     BP      Pulse      Resp      Temp      Temp src      SpO2      Weight      Height      Head Circumference      Peak Flow      Pain Score      Pain Loc      Pain Edu?      Excl. in GC?    No data found.  Updated Vital Signs BP (!) 147/80 (BP Location: Left Arm)   Pulse 72   Temp 98.2 F (36.8 C) (Oral)   Resp 18   SpO2 98%   Visual Acuity Right Eye Distance:   Left Eye Distance:   Bilateral Distance:    Right Eye Near:   Left Eye Near:    Bilateral Near:     Physical Exam Vitals and nursing note reviewed.  Constitutional:      Appearance: She is well-developed.      Comments: No acute distress  HENT:     Head: Normocephalic and atraumatic.     Ears:     Comments: Bilateral ears without tenderness to palpation of external auricle, tragus and mastoid, EAC's without erythema or swelling, TM's with good bony landmarks and cone of light. Non erythematous.      Nose: Nose normal.     Mouth/Throat:     Comments: Oral mucosa pink and moist, no tonsillar enlargement or exudate. Posterior pharynx patent and nonerythematous, no uvula deviation or swelling. Normal phonation.  Eyes:     Conjunctiva/sclera: Conjunctivae normal.  Cardiovascular:     Rate and Rhythm: Normal rate.  Pulmonary:     Effort: Pulmonary effort is normal. No respiratory distress.     Comments: Breathing comfortably at rest, CTABL, no wheezing, rales or other adventitious sounds auscultated  Abdominal:     General: There is no distension.  Musculoskeletal:        General: Normal range of motion.     Cervical back: Neck supple.  Skin:    General: Skin is warm and dry.  Neurological:     Mental Status: She is alert and oriented to person, place, and time.     UC Treatments / Results  Labs (all labs ordered are listed, but only abnormal results are displayed) Labs Reviewed - No data to display  EKG   Radiology No results found.  Procedures Procedures (including critical Christian time)  Medications Ordered in UC Medications - No data to display  Initial Impression / Assessment and Plan / UC Course  I have reviewed the triage vital signs and the nursing notes.  Pertinent labs & imaging results that were available during my Christian of the patient were reviewed by me and considered in my medical decision making (see chart for details).     URI symptoms x1 week, covering for sinusitis with Augmentin, clear to auscultation, discussed further symptomatic and supportive Christian, recommend adding in Flonase, trial of Robitussin with codeine for nighttime cough, discussed drowsiness  associated with this.  Discussed strict return precautions. Patient verbalized understanding and is agreeable with plan.  Final Clinical Impressions(s) / UC Diagnoses   Final diagnoses:  Acute non-recurrent maxillary sinusitis  Discharge Instructions      Begin Augmentin twice daily for 1 week to cover sinus infection Flonase nasal spray 1 to 2 spray in each nostril daily Continue rest and fluids Robitussin with codeine-will cause drowsiness, do not drive or work after taking Please follow-up with nonimprovement or worsening     ED Prescriptions     Medication Sig Dispense Auth. Provider   amoxicillin-clavulanate (AUGMENTIN) 875-125 MG tablet Take 1 tablet by mouth every 12 (twelve) hours. 14 tablet Keelia Graybill C, PA-C   fluticasone (FLONASE) 50 MCG/ACT nasal spray Place 1-2 sprays into both nostrils daily. 16 g Victoriya Pol C, PA-C   guaiFENesin-codeine 100-10 MG/5ML syrup Take 5-10 mLs by mouth 3 (three) times daily as needed for cough. 120 mL Mihaela Fajardo, [place] C, PA-C      I have reviewed the PDMP during this encounter.   Lew Dawes, [place] 10/15/20 1258

## 2023-03-27 ENCOUNTER — Other Ambulatory Visit (HOSPITAL_COMMUNITY): Payer: Self-pay

## 2023-03-27 MED ORDER — SODIUM FLUORIDE 1.1 % DT PSTE
PASTE | DENTAL | 2 refills | Status: DC
  Filled 2023-03-27: qty 100, 30d supply, fill #0

## 2023-12-31 ENCOUNTER — Ambulatory Visit: Admitting: Dermatology

## 2024-01-01 ENCOUNTER — Ambulatory Visit: Admitting: Dermatology

## 2024-01-01 ENCOUNTER — Encounter: Payer: Self-pay | Admitting: Dermatology

## 2024-01-01 VITALS — BP 110/70 | HR 76

## 2024-01-01 DIAGNOSIS — D369 Benign neoplasm, unspecified site: Secondary | ICD-10-CM

## 2024-01-01 DIAGNOSIS — L989 Disorder of the skin and subcutaneous tissue, unspecified: Secondary | ICD-10-CM | POA: Diagnosis not present

## 2024-01-01 NOTE — Patient Instructions (Signed)

## 2024-01-01 NOTE — Progress Notes (Signed)
    New Patient Visit   Subjective  Melinda Christian is a 48 y.o. female who presents for the following: Consult for cyst removal on the left upper eyelid.   Patient states that it was tender to touch and had been present for at least 4 months and continued to grow. She was previously having her eyebrows threaded, but was concerned that was the cause so she stopped. She since popped the lesion about 5 months ago, in April 2025.  The following portions of the chart were reviewed this encounter and updated as appropriate: medications, allergies, medical history  Review of Systems:  No other skin or systemic complaints except as noted in HPI or Assessment and Plan.  Objective  Well appearing patient in no apparent distress; mood and affect are within normal limits.   A focused examination was performed of the following areas: Eyelids   Relevant exam findings are noted in the Assessment and Plan.    Assessment & Plan   SUBCUTANEOUS NODULE- EIC vs Dermoid cyst Exam: Subcutaneous nodule at left upper eyelid  Discussed that due to location, and potential for underlying subcutaneous tissue connections, seeing as it is located in the general area of a Dermoid cyst, would recommend removal with plastic surgery or oculoplastics surgery. Referral placed to Dr. Montorfano.  DERMOID CYST   Related Procedures Ambulatory referral to Plastic Surgery  Return if symptoms worsen or fail to improve.  LILLETTE Melinda Gobble, RN, am acting as scribe for RUFUS CHRISTELLA HOLY, MD .   Documentation: I have reviewed the above documentation for accuracy and completeness, and I agree with the above.  RUFUS CHRISTELLA HOLY, MD

## 2024-01-09 ENCOUNTER — Ambulatory Visit

## 2024-01-09 VITALS — BP 122/82 | HR 71 | Ht 71.0 in | Wt 169.0 lb

## 2024-01-09 DIAGNOSIS — H02826 Cysts of left eye, unspecified eyelid: Secondary | ICD-10-CM

## 2024-01-09 DIAGNOSIS — H0289 Other specified disorders of eyelid: Secondary | ICD-10-CM

## 2024-01-09 NOTE — Progress Notes (Signed)
 NAME: Melinda Christian  MRN: [id number]  DOB: Aug 08, 1975  Referring physician: Corey Rufus HERO, MD  PCP: Larnell Hamilton, MD  CHIEF COMPLAINT: Soft tissue mass of the left upper outer eyelid  HPI:  Melinda Christian is a 48 y.o. year old female who presents with a soft tissue mass that is non painful, soft and has enlarged over the last few months slowly. No episodes of infection noted. Patient states she has popped the cyst before but it tends to regrow. Non-smoker.   PMH: Past Medical History:  Diagnosis Date   Medical history non-contributory    No pertinent past medical history    Postpartum care following vaginal delivery (5/20) 08/29/2014   Wrist pain    left   PSH:  Past Surgical History:  Procedure Laterality Date   DORSAL COMPARTMENT RELEASE Left 06/11/2013   Procedure: LEFT RELEASE DORSAL COMPARTMENT (DEQUERVAIN);  Surgeon: Dempsey JINNY Sensor, MD;  Location: [facility];  Service: Orthopedics;  Laterality: Left;   KNEE ARTHROSCOPY     SHOULDER ARTHROSCOPY     WISDOM TOOTH EXTRACTION     WRIST SURGERY       MEDICATIONS:   Current Outpatient Medications:    cholecalciferol (VITAMIN D3) 25 MCG (1000 UNIT) tablet, Take 1,000 Units by mouth daily., Disp: , Rfl:    cyanocobalamin (VITAMIN B12) 100 MCG tablet, Take 100 mcg by mouth daily., Disp: , Rfl:    estradiol (VIVELLE-DOT) 0.075 MG/24HR, Place 1 patch onto the skin 2 (two) times a week., Disp: , Rfl:    gabapentin (NEURONTIN) 300 MG capsule, 300 mg. Take 2 capsules 3 times a day by oral route for 30 days., Disp: , Rfl:    magnesium  (MAGTAB) 84 MG ( ) TBCR SR tablet, Take 84 mg by mouth., Disp: , Rfl:    Multiple Vitamin (MULTIVITAMIN) tablet, Take 1 tablet by mouth daily., Disp: , Rfl:    progesterone (PROMETRIUM) 100 MG capsule, Take 100 mg by mouth at bedtime., Disp: , Rfl:    ALLERGIES:  has no known allergies.   FAMILY HISTORY:  Family History  Problem Relation Age of Onset    Other Neg Hx       VITALS:  Vitals:   01/09/24 1011  BP: 122/82  Pulse: 71  SpO2: 100%    Constitutional: Good color, good hydration. VSS. Head and Neck: No lymphadenopathy, thyromegaly or masses  Chest: Normal breathing, Normal shape and excursion.  Upper lid: Dermatochalasis of upper lid with excess skin covering lid crease. Cyst palpated on upper outer quadrant of the lid. < 1 cm in diameter. Currently not infected. Mobile and not attached to underlying structures. No basin lymphadenopathy, satellite lesions or in-transit lesions.  ASSESSMENT/PLAN  Assessment & Plan   Pt. presents with a subcutaneous mass most representative of dermoid cyst.  Today we discussed the risks, benefits and alternatives to mass excision and possible tissue rearrangement. We discussed the alternatives which include continued observation; however, I told the patient that I do not believe this mass will resolve on its own. We then discussed the benefits of surgical excision which include complete removal of the lesion. We discussed the risks of excision which include seroma, hematoma, infection, bleeding, damage to surrounding healthy tissue, damage to surrounding structures and need for further surgery. We also discussed the risks of hair loss, wound separation, damage to the levator muscle, blindness and recurrence of the mass. We discussed scar patterns of tissue rearrangement, if needed.  I explained  that the mass will be sent to pathology and if it were to be malignant further surgery may be needed. We discussed the risks of anesthesia which will be further elaborated on by our anesthesia colleagues. The patient has a good understanding of all the risks and benefits, postoperative course and care. We obtained pictures. All questions were answered.    Recommend excision in the Operating room. Risks, benefits and limitations were discussed. Procedure will be precertified and scheduled.  Alaijah Gibler M.  Soloman Mckeithan, MD [facility]

## 2024-03-26 ENCOUNTER — Encounter: Admitting: Surgical

## 2024-03-26 NOTE — H&P (View-Only) (Signed)
 "    Patient ID: Melinda Christian, female    DOB: 1975-06-23, 48 y.o.   MRN: [id number]  Chief Complaint  Patient presents with   Pre-op Exam      ICD-10-CM   1. Eyelid cyst, left  H02.826        History of Present Illness: Melinda Christian is a 48 y.o.  female  with a history of left eyelid cyst.  She presents for preoperative evaluation for upcoming procedure, excision of left eyelid cyst, scheduled for 04/21/2024 with Dr. Montorfano.  The patient has not had problems with anesthesia.  Patient denies any history of cardiac disease.  She denies taking any blood thinners.  Patient reports she does not smoke.  Patient denies taking any birth control or hormone replacement.  She states that she was prescribed estradiol patches and birth control, but she has not taken these in over a month.  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  She denies any personal or family history of blood clots or clotting diseases.  She denies any recent surgeries, traumas or infections.  She denies any history of stroke or heart attack.  She denies any history of Crohn's disease or ulcerative colitis, COPD or asthma.  She denies any history of cancer.  She denies any varicosities to her lower extremities.  She denies any recent fevers, chills or changes in her health.  Summary of Previous Visit: Patient was seen for consult by Dr. Montorfano on 01/09/2024.  At this visit, patient reported that she had a soft tissue mass that was nonpainful, soft and had enlarged over the past few months slowly.  There is no episodes of infection noted.  She reported that she popped the cyst before but it tends to regrow.  On exam, cyst was palpated on the upper outer quadrant of the lid, it was less than 1 cm in diameter.  It was recommended that patient undergo excision in the operating room.  Job: Works as a social research officer, government at [employer], planning to take 1 week off  PMH Significant for: Cyst to the left  eyelid   Past Medical History: Allergies: Allergies[1]  Current Medications: Current Medications[2]  Past Medical Problems: Past Medical History:  Diagnosis Date   Medical history non-contributory    No pertinent past medical history    Postpartum care following vaginal delivery (5/20) 08/29/2014   Wrist pain    left    Past Surgical History: Past Surgical History:  Procedure Laterality Date   DORSAL COMPARTMENT RELEASE Left 06/11/2013   Procedure: LEFT RELEASE DORSAL COMPARTMENT (DEQUERVAIN);  Surgeon: Dempsey JINNY Sensor, MD;  Location: [facility];  Service: Orthopedics;  Laterality: Left;   KNEE ARTHROSCOPY     SHOULDER ARTHROSCOPY     WISDOM TOOTH EXTRACTION     WRIST SURGERY      Social History: Social History   Socioeconomic History   Marital status: Married    Spouse name: Not on file   Number of children: Not on file   Years of education: Not on file   Highest education level: Not on file  Occupational History   Not on file  Tobacco Use   Smoking status: Never   Smokeless tobacco: Never  Substance and Sexual Activity   Alcohol use: Yes    Comment: social   Drug use: No   Sexual activity: Yes    Birth control/protection: Other-see comments    Comment: husband vasectomy  Other Topics Concern  Not on file  Social History Narrative   Not on file   Social Drivers of Health   Tobacco Use: Low Risk (01/09/2024)   Patient History    Smoking Tobacco Use: Never    Smokeless Tobacco Use: Never    Passive Exposure: Not on file  Financial Resource Strain: Not on file  Food Insecurity: Low Risk (11/07/2022)   Received from [facility]   Epic    Within the past 12 months, the food you bought just didn't last and you didn't have money to get more. : Never true    Within the past 12 months, you worried that your food would run out before you got money to buy more: Never true  Transportation Needs: No Transportation Needs (11/07/2022)   Received from  [facility]    In the past 12 months, has lack of reliable transportation kept you from medical appointments, meetings, work or from getting things needed for daily living? : No  Physical Activity: Not on file  Stress: Not on file  Social Connections: Unknown (08/13/2021)   Received from [facility]   Social Network    Social Network: Not on file  Intimate Partner Violence: Unknown (07/11/2021)   Received from [facility]   HITS    Physically Hurt: Not on file    Insult or Talk Down To: Not on file    Threaten Physical Harm: Not on file    Scream or Curse: Not on file  Depression (EYV7-0): Not on file  Alcohol Screen: Not on file  Housing: Low Risk (11/07/2022)   Received from [facility]   Epic    Think about the place you live. Do you have problems with any of the following? Choose all that apply:: None/None on this list    What is your living situation today?: I have a steady place to live  Utilities: Low Risk (11/07/2022)   Received from [facility]   Utilities    In the past 12 months has the electric, gas, oil, or water company threatened to shut off services in your home? : No  Health Literacy: Not on file    Family History: Family History  Problem Relation Age of Onset   Other Neg Hx     Review of Systems: Denies any recent fevers, chills or changes in her health  Physical Exam: Vital Signs BP 117/75 (BP Location: Left Arm, Patient Position: Sitting, Cuff Size: Normal)   Pulse 68   Ht 5' 11 (1.803 m)   Wt 170 lb (77.1 kg)   SpO2 99%   BMI 23.71 kg/m   Physical Exam  Constitutional:      General: Not in acute distress.    Appearance: Normal appearance. Not ill-appearing.  HENT:     Head: Normocephalic and atraumatic.  Eyes:     Pupils: Pupils are equal, round Neck:     Musculoskeletal: Normal range of motion.  Cardiovascular:     Rate and Rhythm: Normal rate Pulmonary:     Effort: Pulmonary effort is normal. No respiratory  distress.  Musculoskeletal: Normal range of motion.  Skin:    General: Skin is warm and dry.     Findings: No erythema or rash.  Neurological:     Mental Status: Alert and oriented to person, place, and time. Mental status is at baseline.  Psychiatric:        Mood and Affect: Mood normal.        Behavior: Behavior  normal.    Assessment/Plan: The patient is scheduled for excision of left eyelid cyst with Dr. Montorfano.  Risks, benefits, and alternatives of procedure discussed, questions answered and consent obtained.    Smoking Status: Non-smoker; Counseling Given?  N/A  Caprini Score: 2; Risk Factors include: length of planned surgery. Recommendation for mechanical prophylaxis. Encourage early ambulation.   Pictures obtained: @consult   Post-op Rx sent to pharmacy: Zofran -went to send in pain medications for patient and per PDMP, it appears that patient recently picked up prescription for tramadol.  I attempted to call her several times in regards to this, but she did not answer or there was not good connection on the phone to be able to discuss.  Our office left a voicemail to have her return the call so we can discuss postop pain management.  Instructed the patient to hold any multivitamins or supplements at least 1 week prior to surgery.  Discussed with her to hold estradiol at least 2 weeks before and 2 weeks after surgery.  Discussed with the patient to not take any gabapentin  at the same time as any pain medications.  Patient expressed understanding.  Patient was provided with the General Surgical Risk consent document and Pain Medication Agreement prior to their appointment.  They had adequate time to read through the risk consent documents and Pain Medication Agreement. We also discussed them in person together during this preop appointment. All of their questions were answered to their satisfaction.  Recommended calling if they have any further questions.  Risk consent form and Pain  Medication Agreement to be scanned into patient's chart.  The consent was obtained with risks and complications reviewed which included bleeding, pain, scar, infection and the risk of anesthesia.  The patients questions were answered to the patients expressed satisfaction.    Electronically signed by: Estefana FORBES Peck, PA-C 03/27/2024 3:58 PM      [1] No Known Allergies [2]  Current Outpatient Medications:    cholecalciferol (VITAMIN D3) 25 MCG (1000 UNIT) tablet, Take 1,000 Units by mouth daily., Disp: , Rfl:    cyanocobalamin (VITAMIN B12) 100 MCG tablet, Take 100 mcg by mouth daily., Disp: , Rfl:    estradiol (VIVELLE-DOT) 0.075 MG/24HR, Place 1 patch onto the skin 2 (two) times a week., Disp: , Rfl:    gabapentin  (NEURONTIN ) 300 MG capsule, 300 mg. Take 2 capsules 3 times a day by oral route for 30 days., Disp: , Rfl:    magnesium  (MAGTAB) 84 MG ( ) TBCR SR tablet, Take 84 mg by mouth., Disp: , Rfl:    Multiple Vitamin (MULTIVITAMIN) tablet, Take 1 tablet by mouth daily., Disp: , Rfl:    progesterone (PROMETRIUM) 100 MG capsule, Take 100 mg by mouth at bedtime., Disp: , Rfl:   "

## 2024-03-26 NOTE — Progress Notes (Unsigned)
 Patient ID: Melinda Christian, female    DOB: November 13, 1975, 48 y.o.   MRN: [id number]  No chief complaint on file.   No diagnosis found.   History of Present Illness: Melinda Christian is a 48 y.o.  female  with a history of left eyelid cyst.  She presents for preoperative evaluation for upcoming procedure, excision of left eyelid cyst, scheduled for 04/21/2024 with Dr. Montorfano.  The patient {HAS HAS WNU:[id number]} had problems with anesthesia. ***  Summary of Previous Visit: Patient was seen for consult by Dr. Montorfano on 01/09/2024.  At this visit, patient reported that she had a soft tissue mass that was nonpainful, soft and had enlarged over the past few months slowly.  There is no episodes of infection noted.  She reported that she popped the cyst before but it tends to regrow.  On exam, cyst was palpated on the upper outer quadrant of the lid, it was less than 1 cm in diameter.  It was recommended that patient undergo excision in the operating room.  Job: ***  PMH Significant for: ***   Past Medical History: Allergies: Allergies[1]  Current Medications: Current Medications[2]  Past Medical Problems: Past Medical History:  Diagnosis Date   Medical history non-contributory    No pertinent past medical history    Postpartum care following vaginal delivery (5/20) 08/29/2014   Wrist pain    left    Past Surgical History: Past Surgical History:  Procedure Laterality Date   DORSAL COMPARTMENT RELEASE Left 06/11/2013   Procedure: LEFT RELEASE DORSAL COMPARTMENT (DEQUERVAIN);  Surgeon: Dempsey JINNY Sensor, MD;  Location: [facility];  Service: Orthopedics;  Laterality: Left;   KNEE ARTHROSCOPY     SHOULDER ARTHROSCOPY     WISDOM TOOTH EXTRACTION     WRIST SURGERY      Social History: Social History   Socioeconomic History   Marital status: Married    Spouse name: Not on file   Number of children: Not on file   Years of education: Not on file    Highest education level: Not on file  Occupational History   Not on file  Tobacco Use   Smoking status: Never   Smokeless tobacco: Never  Substance and Sexual Activity   Alcohol use: Yes    Comment: social   Drug use: No   Sexual activity: Yes    Birth control/protection: Other-see comments    Comment: husband vasectomy  Other Topics Concern   Not on file  Social History Narrative   Not on file   Social Drivers of Health   Tobacco Use: Low Risk (01/09/2024)   Patient History    Smoking Tobacco Use: Never    Smokeless Tobacco Use: Never    Passive Exposure: Not on file  Financial Resource Strain: Not on file  Food Insecurity: Low Risk (11/07/2022)   Received from [facility]   Epic    Within the past 12 months, the food you bought just didn't last and you didn't have money to get more. : Never true    Within the past 12 months, you worried that your food would run out before you got money to buy more: Never true  Transportation Needs: No Transportation Needs (11/07/2022)   Received from [facility]    In the past 12 months, has lack of reliable transportation kept you from medical appointments, meetings, work or from getting things needed for daily living? : No  Physical Activity: Not on file  Stress: Not on file  Social Connections: Unknown (08/13/2021)   Received from [facility]   Social Network    Social Network: Not on file  Intimate Partner Violence: Unknown (07/11/2021)   Received from [facility]   HITS    Physically Hurt: Not on file    Insult or Talk Down To: Not on file    Threaten Physical Harm: Not on file    Scream or Curse: Not on file  Depression (EYV7-0): Not on file  Alcohol Screen: Not on file  Housing: Low Risk (11/07/2022)   Received from [facility]   Epic    Think about the place you live. Do you have problems with any of the following? Choose all that apply:: None/None on this list    What is your living situation today?:  I have a steady place to live  Utilities: Low Risk (11/07/2022)   Received from [facility]   Utilities    In the past 12 months has the electric, gas, oil, or water company threatened to shut off services in your home? : No  Health Literacy: Not on file    Family History: Family History  Problem Relation Age of Onset   Other Neg Hx     Review of Systems: ROS  Physical Exam: Vital Signs There were no vitals taken for this visit.  Physical Exam *** Constitutional:      General: Not in acute distress.    Appearance: Normal appearance. Not ill-appearing.  HENT:     Head: Normocephalic and atraumatic.  Eyes:     Pupils: Pupils are equal, round Neck:     Musculoskeletal: Normal range of motion.  Cardiovascular:     Rate and Rhythm: Normal rate    Pulses: Normal pulses.  Pulmonary:     Effort: Pulmonary effort is normal. No respiratory distress.  Abdominal:     General: Abdomen is flat. There is no distension.  Musculoskeletal: Normal range of motion.  Skin:    General: Skin is warm and dry.     Findings: No erythema or rash.  Neurological:     General: No focal deficit present.     Mental Status: Alert and oriented to person, place, and time. Mental status is at baseline.     Motor: No weakness.  Psychiatric:        Mood and Affect: Mood normal.        Behavior: Behavior normal.    Assessment/Plan: The patient is scheduled for excision of left eyelid cyst with Dr. Montorfano.  Risks, benefits, and alternatives of procedure discussed, questions answered and consent obtained.    Smoking Status: ***; Counseling Given? *** Last Mammogram: ***; Results: ***  Caprini Score: ***; Risk Factors include: ***, BMI *** 25, and length of planned surgery. Recommendation for mechanical *** prophylaxis. Encourage early ambulation.   Pictures obtained: @consult ***  Post-op Rx sent to pharmacy: {Blank:19197::Oxycodone , Zofran , Keflex,Oxycodone , Zofran }  Patient was  provided with the *** General Surgical Risk consent document and Pain Medication Agreement prior to their appointment.  They had adequate time to read through the risk consent documents and Pain Medication Agreement. We also discussed them in person together during this preop appointment. All of their questions were answered to their satisfaction.  Recommended calling if they have any further questions.  Risk consent form and Pain Medication Agreement to be scanned into patient's chart.  ***   Electronically signed by: Estefana FORBES Peck, PA-C 03/26/2024  2:09 PM     [1] No Known Allergies [2]  Current Outpatient Medications:    cholecalciferol (VITAMIN D3) 25 MCG (1000 UNIT) tablet, Take 1,000 Units by mouth daily., Disp: , Rfl:    cyanocobalamin (VITAMIN B12) 100 MCG tablet, Take 100 mcg by mouth daily., Disp: , Rfl:    estradiol (VIVELLE-DOT) 0.075 MG/24HR, Place 1 patch onto the skin 2 (two) times a week., Disp: , Rfl:    gabapentin (NEURONTIN) 300 MG capsule, 300 mg. Take 2 capsules 3 times a day by oral route for 30 days., Disp: , Rfl:    magnesium  (MAGTAB) 84 MG ( ) TBCR SR tablet, Take 84 mg by mouth., Disp: , Rfl:    Multiple Vitamin (MULTIVITAMIN) tablet, Take 1 tablet by mouth daily., Disp: , Rfl:    progesterone (PROMETRIUM) 100 MG capsule, Take 100 mg by mouth at bedtime., Disp: , Rfl:

## 2024-03-27 ENCOUNTER — Ambulatory Visit: Admitting: Student

## 2024-03-27 VITALS — BP 117/75 | HR 68 | Ht 71.0 in | Wt 170.0 lb

## 2024-03-27 DIAGNOSIS — H02824 Cysts of left upper eyelid: Secondary | ICD-10-CM

## 2024-03-27 DIAGNOSIS — H02826 Cysts of left eye, unspecified eyelid: Secondary | ICD-10-CM

## 2024-03-28 MED ORDER — ONDANSETRON HCL 4 MG PO TABS: 4.0000 mg | ORAL_TABLET | Freq: Three times a day (TID) | ORAL | 0 refills | Status: AC | PRN

## 2024-04-14 ENCOUNTER — Encounter (HOSPITAL_BASED_OUTPATIENT_CLINIC_OR_DEPARTMENT_OTHER): Payer: Self-pay

## 2024-04-14 ENCOUNTER — Other Ambulatory Visit: Payer: Self-pay

## 2024-04-21 ENCOUNTER — Ambulatory Visit (HOSPITAL_BASED_OUTPATIENT_CLINIC_OR_DEPARTMENT_OTHER): Admitting: Anesthesiology

## 2024-04-21 ENCOUNTER — Other Ambulatory Visit: Payer: Self-pay

## 2024-04-21 ENCOUNTER — Encounter (HOSPITAL_BASED_OUTPATIENT_CLINIC_OR_DEPARTMENT_OTHER): Payer: Self-pay

## 2024-04-21 ENCOUNTER — Encounter (HOSPITAL_BASED_OUTPATIENT_CLINIC_OR_DEPARTMENT_OTHER): Admission: RE | Disposition: A | Discharge status: Home / Self Care | Payer: Self-pay | Source: Home / Self Care

## 2024-04-21 ENCOUNTER — Ambulatory Visit (HOSPITAL_BASED_OUTPATIENT_CLINIC_OR_DEPARTMENT_OTHER): Admission: RE | Admit: 2024-04-21 | Discharge: 2024-04-21 | Disposition: A | Discharge status: Home / Self Care

## 2024-04-21 DIAGNOSIS — H02826 Cysts of left eye, unspecified eyelid: Secondary | ICD-10-CM | POA: Diagnosis not present

## 2024-04-21 DIAGNOSIS — L720 Epidermal cyst: Secondary | ICD-10-CM | POA: Insufficient documentation

## 2024-04-21 DIAGNOSIS — Z01818 Encounter for other preprocedural examination: Secondary | ICD-10-CM

## 2024-04-21 HISTORY — PX: LID LESION EXCISION: SHX5204

## 2024-04-21 LAB — POCT PREGNANCY, URINE: Preg Test, Ur: NEGATIVE

## 2024-04-21 SURGERY — EXCISION, LESION, EYELID
Anesthesia: General | Laterality: Left

## 2024-04-21 MED ORDER — EPHEDRINE 5 MG/ML INJ
INTRAVENOUS | Status: AC
  Filled 2024-04-21: qty 5

## 2024-04-21 MED ORDER — ACETAMINOPHEN 500 MG PO TABS
ORAL_TABLET | ORAL | Status: AC
  Filled 2024-04-21: qty 2

## 2024-04-21 MED ORDER — BACITRACIN ZINC 500 UNIT/GM EX OINT
TOPICAL_OINTMENT | CUTANEOUS | Status: AC
  Filled 2024-04-21: qty 28.35

## 2024-04-21 MED ORDER — OXYCODONE HCL 5 MG PO TABS
ORAL_TABLET | ORAL | Status: AC
  Filled 2024-04-21: qty 1

## 2024-04-21 MED ORDER — CEFAZOLIN SODIUM-DEXTROSE 2-4 GM/100ML-% IV SOLN
INTRAVENOUS | Status: AC
  Filled 2024-04-21: qty 100

## 2024-04-21 MED ORDER — BUPIVACAINE HCL (PF) 0.25 % IJ SOLN
INTRAMUSCULAR | Status: AC
  Filled 2024-04-21: qty 60

## 2024-04-21 MED ORDER — SUGAMMADEX SODIUM 200 MG/2ML IV SOLN
INTRAVENOUS | Status: DC | PRN
  Administered 2024-04-21: 200 mg via INTRAVENOUS

## 2024-04-21 MED ORDER — GABAPENTIN 300 MG PO CAPS
300.0000 mg | ORAL_CAPSULE | ORAL | Status: AC
  Administered 2024-04-21: 300 mg via ORAL

## 2024-04-21 MED ORDER — FENTANYL CITRATE (PF) 100 MCG/2ML IJ SOLN: 25.0000 ug | INTRAMUSCULAR | Status: DC | PRN

## 2024-04-21 MED ORDER — DEXMEDETOMIDINE HCL IN NACL 80 MCG/20ML IV SOLN
INTRAVENOUS | Status: DC | PRN
  Administered 2024-04-21: 8 ug via INTRAVENOUS

## 2024-04-21 MED ORDER — OXYCODONE HCL 5 MG/5ML PO SOLN: 5.0000 mg | Freq: Once | ORAL | Status: AC | PRN

## 2024-04-21 MED ORDER — OXYCODONE HCL 5 MG PO TABS
5.0000 mg | ORAL_TABLET | Freq: Once | ORAL | Status: AC | PRN
  Administered 2024-04-21: 5 mg via ORAL

## 2024-04-21 MED ORDER — EPINEPHRINE PF 1 MG/ML IJ SOLN
INTRAMUSCULAR | Status: AC
  Filled 2024-04-21: qty 1

## 2024-04-21 MED ORDER — FENTANYL CITRATE (PF) 100 MCG/2ML IJ SOLN
INTRAMUSCULAR | Status: DC | PRN
  Administered 2024-04-21: 50 ug via INTRAVENOUS

## 2024-04-21 MED ORDER — LIDOCAINE 2% (20 MG/ML) 5 ML SYRINGE
INTRAMUSCULAR | Status: DC | PRN
  Administered 2024-04-21: 60 mg via INTRAVENOUS

## 2024-04-21 MED ORDER — BUPIVACAINE-EPINEPHRINE 0.25% -1:200000 IJ SOLN
INTRAMUSCULAR | Status: DC | PRN
  Administered 2024-04-21: 1 mL

## 2024-04-21 MED ORDER — POVIDONE-IODINE 5 % OP SOLN
OPHTHALMIC | Status: AC
  Filled 2024-04-21: qty 30

## 2024-04-21 MED ORDER — BUPIVACAINE-EPINEPHRINE (PF) 0.25% -1:200000 IJ SOLN
INTRAMUSCULAR | Status: AC
  Filled 2024-04-21: qty 30

## 2024-04-21 MED ORDER — BACITRACIN-POLYMYXIN B 500-10000 UNIT/GM OP OINT
TOPICAL_OINTMENT | OPHTHALMIC | Status: AC
  Filled 2024-04-21: qty 3.5

## 2024-04-21 MED ORDER — AMISULPRIDE (ANTIEMETIC) 5 MG/2ML IV SOLN: 10.0000 mg | Freq: Once | INTRAVENOUS | Status: DC | PRN

## 2024-04-21 MED ORDER — DEXAMETHASONE SOD PHOSPHATE PF 10 MG/ML IJ SOLN
INTRAMUSCULAR | Status: DC | PRN
  Administered 2024-04-21: 10 mg via INTRAVENOUS

## 2024-04-21 MED ORDER — CEFAZOLIN SODIUM-DEXTROSE 2-4 GM/100ML-% IV SOLN
2.0000 g | INTRAVENOUS | Status: AC
  Administered 2024-04-21: 2 g via INTRAVENOUS

## 2024-04-21 MED ORDER — GABAPENTIN 300 MG PO CAPS
ORAL_CAPSULE | ORAL | Status: AC
  Filled 2024-04-21: qty 1

## 2024-04-21 MED ORDER — MIDAZOLAM HCL 2 MG/2ML IJ SOLN
INTRAMUSCULAR | Status: AC
  Filled 2024-04-21: qty 2

## 2024-04-21 MED ORDER — DEXAMETHASONE SOD PHOSPHATE PF 10 MG/ML IJ SOLN
INTRAMUSCULAR | Status: AC
  Filled 2024-04-21: qty 1

## 2024-04-21 MED ORDER — BSS IO SOLN
INTRAOCULAR | Status: AC
  Filled 2024-04-21: qty 15

## 2024-04-21 MED ORDER — FENTANYL CITRATE (PF) 100 MCG/2ML IJ SOLN
INTRAMUSCULAR | Status: AC
  Filled 2024-04-21: qty 2

## 2024-04-21 MED ORDER — LACTATED RINGERS IV SOLN: INTRAVENOUS | Status: DC

## 2024-04-21 MED ORDER — CHLORHEXIDINE GLUCONATE CLOTH 2 % EX PADS: 6.0000 | MEDICATED_PAD | Freq: Once | CUTANEOUS | Status: DC

## 2024-04-21 MED ORDER — ACETAMINOPHEN 500 MG PO TABS
1000.0000 mg | ORAL_TABLET | ORAL | Status: AC
  Administered 2024-04-21: 1000 mg via ORAL

## 2024-04-21 MED ORDER — BACITRACIN-POLYMYXIN B 500-10000 UNIT/GM OP OINT
TOPICAL_OINTMENT | OPHTHALMIC | Status: DC | PRN
  Administered 2024-04-21: 1 via OPHTHALMIC

## 2024-04-21 MED ORDER — ARTIFICIAL TEARS OPHTHALMIC OINT
TOPICAL_OINTMENT | OPHTHALMIC | Status: AC
  Filled 2024-04-21: qty 3.5

## 2024-04-21 MED ORDER — ONDANSETRON HCL 4 MG/2ML IJ SOLN
INTRAMUSCULAR | Status: AC
  Filled 2024-04-21: qty 2

## 2024-04-21 MED ORDER — MIDAZOLAM HCL 5 MG/5ML IJ SOLN
INTRAMUSCULAR | Status: DC | PRN
  Administered 2024-04-21: 2 mg via INTRAVENOUS

## 2024-04-21 MED ORDER — DEXMEDETOMIDINE HCL IN NACL 80 MCG/20ML IV SOLN
INTRAVENOUS | Status: AC
  Filled 2024-04-21: qty 20

## 2024-04-21 MED ORDER — BSS IO SOLN
INTRAOCULAR | Status: DC | PRN
  Administered 2024-04-21: 1 via INTRAOCULAR

## 2024-04-21 MED ORDER — EPHEDRINE SULFATE-NACL 50-0.9 MG/10ML-% IV SOSY
PREFILLED_SYRINGE | INTRAVENOUS | Status: DC | PRN
  Administered 2024-04-21: 5 mg via INTRAVENOUS
  Administered 2024-04-21: 10 mg via INTRAVENOUS
  Administered 2024-04-21: 5 mg via INTRAVENOUS

## 2024-04-21 MED ORDER — ONDANSETRON HCL 4 MG/2ML IJ SOLN
INTRAMUSCULAR | Status: DC | PRN
  Administered 2024-04-21: 4 mg via INTRAVENOUS

## 2024-04-21 MED ORDER — LIDOCAINE 2% (20 MG/ML) 5 ML SYRINGE
INTRAMUSCULAR | Status: AC
  Filled 2024-04-21: qty 5

## 2024-04-21 MED ORDER — ROCURONIUM BROMIDE 10 MG/ML (PF) SYRINGE
PREFILLED_SYRINGE | INTRAVENOUS | Status: AC
  Filled 2024-04-21: qty 10

## 2024-04-21 MED ORDER — ROCURONIUM BROMIDE 10 MG/ML (PF) SYRINGE
PREFILLED_SYRINGE | INTRAVENOUS | Status: DC | PRN
  Administered 2024-04-21: 50 mg via INTRAVENOUS

## 2024-04-21 MED ORDER — PROPOFOL 10 MG/ML IV BOLUS
INTRAVENOUS | Status: DC | PRN
  Administered 2024-04-21: 150 mg via INTRAVENOUS

## 2024-04-21 MED ORDER — LIDOCAINE-EPINEPHRINE 1 %-1:100000 IJ SOLN
INTRAMUSCULAR | Status: AC
  Filled 2024-04-21: qty 2

## 2024-04-21 SURGICAL SUPPLY — 68 items
APPLICATOR DR MATTHEWS STRL (MISCELLANEOUS) IMPLANT
BLADE CLIPPER SURG (BLADE) IMPLANT
BLADE MINI RND TIP GREEN BEAV (BLADE) IMPLANT
BLADE SURG 10 STRL SS (BLADE) IMPLANT
BLADE SURG 11 STRL SS (BLADE) ×1 IMPLANT
BLADE SURG 15 STRL LF DISP TIS (BLADE) ×1 IMPLANT
CANISTER SUCT 1200ML W/VALVE (MISCELLANEOUS) IMPLANT
CLEANSER WND VASHE 34 (WOUND CARE) ×1 IMPLANT
CORD BIPOLAR FORCEPS 12FT (ELECTRODE) IMPLANT
COTTONBALL LRG STERILE PKG (GAUZE/BANDAGES/DRESSINGS) IMPLANT
COVER BACK TABLE 60X90IN (DRAPES) IMPLANT
COVER MAYO STAND STRL (DRAPES) IMPLANT
DERMABOND ADVANCED .7 DNX12 (GAUZE/BANDAGES/DRESSINGS) IMPLANT
DRAPE LAPAROTOMY T 102X78X121 (DRAPES) IMPLANT
DRAPE U-SHAPE 76X120 STRL (DRAPES) IMPLANT
DRAPE UTILITY XL STRL (DRAPES) ×1 IMPLANT
DRSG ADAPTIC 3X8 NADH LF (GAUZE/BANDAGES/DRESSINGS) IMPLANT
DRSG EMULSION OIL 3X3 NADH (GAUZE/BANDAGES/DRESSINGS) IMPLANT
ELECT COATED BLADE 2.86 ST (ELECTRODE) IMPLANT
ELECT NEEDLE BLADE 2-5/6 (NEEDLE) IMPLANT
ELECTRODE REM PT RETRN 9FT PED (ELECTROSURGICAL) IMPLANT
ELECTRODE REM PT RTRN 9FT ADLT (ELECTROSURGICAL) IMPLANT
GAUZE PAD ABD 8X10 STRL (GAUZE/BANDAGES/DRESSINGS) IMPLANT
GAUZE SPONGE 4X4 12PLY STRL LF (GAUZE/BANDAGES/DRESSINGS) IMPLANT
GAUZE XEROFORM 1X8 LF (GAUZE/BANDAGES/DRESSINGS) IMPLANT
GAUZE XEROFORM 5X9 LF (GAUZE/BANDAGES/DRESSINGS) IMPLANT
GLOVE BIO SURGEON STRL SZ 6.5 (GLOVE) IMPLANT
GLOVE BIO SURGEON STRL SZ7.5 (GLOVE) IMPLANT
GLOVE BIO SURGEON STRL SZ8 (GLOVE) ×1 IMPLANT
GLOVE BIOGEL PI IND STRL 7.0 (GLOVE) IMPLANT
GLOVE BIOGEL PI IND STRL 8 (GLOVE) ×1 IMPLANT
GOWN STRL REUS W/ TWL LRG LVL3 (GOWN DISPOSABLE) IMPLANT
GOWN STRL REUS W/ TWL XL LVL3 (GOWN DISPOSABLE) IMPLANT
GOWN STRL REUS W/TWL XL LVL3 (GOWN DISPOSABLE) ×1 IMPLANT
HYDROGEN PEROXIDE 16OZ (MISCELLANEOUS) ×1 IMPLANT
NEEDLE HYPO 25GX1X1/2 BEV (NEEDLE) ×1 IMPLANT
NEEDLE HYPO 30GX1 BEV (NEEDLE) IMPLANT
NEEDLE PRECISIONGLIDE 27X1.5 (NEEDLE) IMPLANT
PACK BASIN DAY SURGERY FS (CUSTOM PROCEDURE TRAY) ×1 IMPLANT
PACK UNIVERSAL I (CUSTOM PROCEDURE TRAY) ×1 IMPLANT
PENCIL SMOKE EVACUATOR (MISCELLANEOUS) IMPLANT
SHEET MEDIUM DRAPE 40X70 STRL (DRAPES) IMPLANT
SLEEVE SCD COMPRESS KNEE MED (STOCKING) IMPLANT
SOL PREP POV-IOD 4OZ 10% (MISCELLANEOUS) ×1 IMPLANT
SOLN 0.9% NACL POUR BTL 1000ML (IV SOLUTION) IMPLANT
SPONGE T-LAP 18X18 ~~LOC~~+RFID (SPONGE) ×1 IMPLANT
STAPLER SKIN PROX WIDE 3.9 (STAPLE) ×1 IMPLANT
STRIP CLOSURE SKIN 1/2X4 (GAUZE/BANDAGES/DRESSINGS) IMPLANT
STRIP SUTURE WOUND CLOSURE 1/2 (MISCELLANEOUS) IMPLANT
SUCTION TUBE FRAZIER 10FR DISP (SUCTIONS) IMPLANT
SUT ETHILON 4 0 PS 2 18 (SUTURE) IMPLANT
SUT ETHILON 5 0 PS 2 18 (SUTURE) IMPLANT
SUT ETHILON 6 0 PS 3 18 (SUTURE) IMPLANT
SUT MNCRL AB 4-0 PS2 18 (SUTURE) IMPLANT
SUT MON AB 5-0 P3 18 (SUTURE) IMPLANT
SUT PDS AB 3-0 CT2 27 (SUTURE) IMPLANT
SUT PDS II 3-0 CT2 27 ABS (SUTURE) IMPLANT
SUT PLAIN GUT FAST 5-0 (SUTURE) IMPLANT
SUT PROLENE 5 0 P 3 (SUTURE) IMPLANT
SUT VIC AB 0 CT1 18XCR BRD 8 (SUTURE) IMPLANT
SUT VIC AB 3-0 SH 27X BRD (SUTURE) IMPLANT
SUT VIC AB 4-0 PS2 18 (SUTURE) IMPLANT
SUT VICRYL RAPIDE 4-0 (SUTURE) IMPLANT
SYR BULB EAR ULCER 3OZ GRN STR (SYRINGE) ×2 IMPLANT
SYR CONTROL 10ML LL (SYRINGE) ×1 IMPLANT
TOWEL GREEN STERILE FF (TOWEL DISPOSABLE) ×1 IMPLANT
TRAY DSU PREP LF (CUSTOM PROCEDURE TRAY) IMPLANT
TUBE CONNECTING 20X1/4 (TUBING) ×1 IMPLANT

## 2024-04-21 NOTE — Op Note (Signed)
 Operative Note   DATE OF OPERATION: 04/21/2024  LOCATION: [facility]  SURGICAL DEPARTMENT: Plastic Surgery  PREOPERATIVE DIAGNOSES:  Left Eyelid Cyst  POSTOPERATIVE DIAGNOSES:  same  PROCEDURE:  Excision of left upper eyelid cyst  SURGEON: Aunesty Tyson, MD  ASSISTANT: N/A  ANESTHESIA:  General.   COMPLICATIONS: None.   INDICATIONS FOR PROCEDURE:  The patient, Melinda Christian is a 49 y.o. female born on [birth year], is here for treatment of left upepr eyelid cyst.   MRN: [id number]  CONSENT:  Informed consent was obtained directly from the patient. Risks, benefits and alternatives were fully discussed. Specific risks including but not limited to bleeding, infection, hematoma, seroma, scarring, pain, infection, contracture, asymmetry, wound healing problems, and need for further surgery were all discussed. The patient did have an ample opportunity to have questions answered to satisfaction.   DESCRIPTION OF PROCEDURE:  The patient was taken to the operating room. SCDs were placed and IV antibiotics were given. The patient's operative site was prepped and draped in a sterile fashion. A time out was performed and all information was confirmed to be correct.  General anesthesia was administered.    The cyst was marked over the left upper outer eyelid. An eye shield with ophthalmic ointment was applied to protect the eye. Local anesthesia was injected (Marcaine  0.25% with epinephrine ). The incision was then made on around the mass. The mass was circumferentially dissected down to orbicularis muscle, and it should be noted that it had cystic appearance. The size of the mass was 0.8 cm x 0.4 cm. The mass was removed and sent as a pathological specimen. Hemostasis was achieved with bipolar. The residual 1 centimeter wound was closed using 6-0 Nylon and 5-0 Fast Gut. The incision was dressed with bacitracin . Postoperative instrument and needle counts were  correct. The patient tolerated the procedure well.  There were no complications. The eye shield was removed without any issues. The patient was allowed to wake from anesthesia, extubated and taken to the recovery room in satisfactory condition.   Babatunde Seago M. Chaniah Cisse, MD [facility]

## 2024-04-21 NOTE — Transfer of Care (Signed)
 Immediate Anesthesia Transfer of Care Note  Patient: Melinda Christian  Procedure(s) Performed: EXCISION, LESION, EYELID (Left)  Patient Location: PACU  Anesthesia Type:General  Level of Consciousness: drowsy  Airway & Oxygen Therapy: Patient Spontanous Breathing and Patient connected to face mask oxygen  Post-op Assessment: Report given to RN and Post -op Vital signs reviewed and stable  Post vital signs: Reviewed and stable  Last Vitals:  Vitals Value Taken Time  BP 112/61 04/21/24 08:35  Temp    Pulse 70 04/21/24 08:36  Resp 14 04/21/24 08:36  SpO2 100 % 04/21/24 08:36  Vitals shown include unfiled device data.  Last Pain:  Vitals:   04/21/24 0634  TempSrc: Tympanic  PainSc: 0-No pain      Patients Stated Pain Goal: 7 (04/21/24 9365)  Complications: No notable events documented.

## 2024-04-21 NOTE — Interval H&P Note (Signed)
 History and Physical Interval Note:  04/21/2024 7:05 AM  Melinda Christian  has presented today for surgery, with the diagnosis of eyelid cyst.  The various methods of treatment have been discussed with the patient and family. After consideration of risks, benefits and other options for treatment, the patient has consented to  Procedures with comments: EXCISION, LESION, EYELID (Left) - Left eyelid cyst excision as a surgical intervention.  The patient's history has been reviewed, patient examined, no change in status, stable for surgery.  I have reviewed the patient's chart and labs.  Questions were answered to the patient's satisfaction.     Martyn Timme M Elba Dendinger

## 2024-04-21 NOTE — Anesthesia Postprocedure Evaluation (Signed)
"   Anesthesia Post Note  Patient: Beatrix Breece  Procedure(s) Performed: EXCISION, LESION, EYELID (Left)     Patient location during evaluation: PACU Anesthesia Type: General Level of consciousness: awake and alert Pain management: pain level controlled Vital Signs Assessment: post-procedure vital signs reviewed and stable Respiratory status: spontaneous breathing, nonlabored ventilation, respiratory function stable and patient connected to nasal cannula oxygen Cardiovascular status: blood pressure returned to baseline and stable Postop Assessment: no apparent nausea or vomiting Anesthetic complications: no   No notable events documented.  Last Vitals:  Vitals:   04/21/24 0915 04/21/24 0945  BP: 105/65 124/71  Pulse: (!) 59 77  Resp: 11 20  Temp:  (!) 36.4 C  SpO2: 99% 100%    Last Pain:  Vitals:   04/21/24 0945  TempSrc: Temporal  PainSc: 3                  Linzee Depaul L Ryen Rhames      "

## 2024-04-21 NOTE — Anesthesia Procedure Notes (Signed)
 Procedure Name: Intubation Date/Time: 04/21/2024 7:34 AM  Performed by: Claudene Delon SQUIBB, CRNAPre-anesthesia Checklist: Patient identified, Emergency Drugs available, Suction available and Patient being monitored Patient Re-evaluated:Patient Re-evaluated prior to induction Oxygen Delivery Method: Circle System Utilized Preoxygenation: Pre-oxygenation with 100% oxygen Induction Type: IV induction Ventilation: Mask ventilation without difficulty Laryngoscope Size: Mac and 3 Grade View: Grade I Tube type: Oral Number of attempts: 1 Airway Equipment and Method: Stylet Placement Confirmation: ETT inserted through vocal cords under direct vision, positive ETCO2 and breath sounds checked- equal and bilateral Secured at: 21 cm Tube secured with: Tape Dental Injury: Teeth and Oropharynx as per pre-operative assessment

## 2024-04-21 NOTE — Discharge Instructions (Addendum)
 Activity (include date of return to work if known) As tolerated: NO showers for 48 hours NO driving No heavy activities  Diet:regular No restrictions  Wound Care: Keep dressing clean & dry  Do not change dressings Special Instructions: Call Doctor if any unusual problems occur such as pain, excessive Bleeding, unrelieved Nausea/vomiting, Fever &/or chills When lying down, keep head elevated on 2-3 pillows or back-rest Follow-up appointment: Please call the office.  The patient received discharge instruction from:___________________________________________   Patient signature ________________________________________ / Date___________  Signature of individual providing instructions/ Date________________               No Tylenol  before 12:30pm.   Post Anesthesia Home Care Instructions  Activity: Get plenty of rest for the remainder of the day. A responsible individual must stay with you for 24 hours following the procedure.  For the next 24 hours, DO NOT: -Drive a car -Advertising copywriter -Drink alcoholic beverages -Take any medication unless instructed by your physician -Make any legal decisions or sign important papers.  Meals: Start with liquid foods such as gelatin or soup. Progress to regular foods as tolerated. Avoid greasy, spicy, heavy foods. If nausea and/or vomiting occur, drink only clear liquids until the nausea and/or vomiting subsides. Call your physician if vomiting continues.  Special Instructions/Symptoms: Your throat may feel dry or sore from the anesthesia or the breathing tube placed in your throat during surgery. If this causes discomfort, gargle with warm salt water. The discomfort should disappear within 24 hours.

## 2024-04-21 NOTE — Anesthesia Preprocedure Evaluation (Addendum)
"                                    Anesthesia Evaluation  Patient identified by MRN, date of birth, ID band Patient awake    Reviewed: Allergy & Precautions, NPO status , Patient's Chart, lab work & pertinent test results  Airway Mallampati: I  TM Distance: >3 FB Neck ROM: Full    Dental no notable dental hx. (+) Teeth Intact, Dental Advisory Given   Pulmonary neg pulmonary ROS   Pulmonary exam normal breath sounds clear to auscultation       Cardiovascular negative cardio ROS Normal cardiovascular exam Rhythm:Regular Rate:Normal     Neuro/Psych negative neurological ROS  negative psych ROS   GI/Hepatic negative GI ROS, Neg liver ROS,,,  Endo/Other  negative endocrine ROS    Renal/GU negative Renal ROS  negative genitourinary   Musculoskeletal negative musculoskeletal ROS (+)    Abdominal   Peds  Hematology negative hematology ROS (+)   Anesthesia Other Findings   Reproductive/Obstetrics                              Anesthesia Physical Anesthesia Plan  ASA: 1  Anesthesia Plan: General   Post-op Pain Management: Tylenol  PO (pre-op)*   Induction: Intravenous  PONV Risk Score and Plan: 3 and Midazolam , Dexamethasone  and Ondansetron   Airway Management Planned: Oral ETT and LMA  Additional Equipment:   Intra-op Plan:   Post-operative Plan: Extubation in OR  Informed Consent: I have reviewed the patients History and Physical, chart, labs and discussed the procedure including the risks, benefits and alternatives for the proposed anesthesia with the patient or authorized representative who has indicated his/her understanding and acceptance.     Dental advisory given  Plan Discussed with: CRNA  Anesthesia Plan Comments:          Anesthesia Quick Evaluation  "

## 2024-04-22 ENCOUNTER — Encounter (HOSPITAL_BASED_OUTPATIENT_CLINIC_OR_DEPARTMENT_OTHER): Payer: Self-pay

## 2024-04-22 ENCOUNTER — Telehealth: Payer: Self-pay

## 2024-04-22 LAB — SURGICAL PATHOLOGY

## 2024-04-22 MED ORDER — OXYCODONE HCL 5 MG PO TABS: 5.0000 mg | ORAL_TABLET | Freq: Three times a day (TID) | ORAL | 0 refills | Status: AC | PRN

## 2024-04-22 MED ORDER — IBUPROFEN 600 MG PO TABS: 600.0000 mg | ORAL_TABLET | Freq: Three times a day (TID) | ORAL | 0 refills | Status: AC | PRN

## 2024-04-22 MED ORDER — ACETAMINOPHEN 500 MG PO TABS: 500.0000 mg | ORAL_TABLET | Freq: Four times a day (QID) | ORAL | 0 refills | Status: AC | PRN

## 2024-04-22 MED ORDER — OXYCODONE HCL 5 MG PO TABS: 5.0000 mg | ORAL_TABLET | ORAL | 0 refills | Status: DC | PRN

## 2024-04-22 NOTE — Addendum Note (Signed)
 Addended by: EUSTACIO POUR on: 04/22/2024 04:59 PM   Modules accepted: Orders

## 2024-04-22 NOTE — Addendum Note (Signed)
 Addended by: EUSTACIO POUR on: 04/22/2024 01:00 PM   Modules accepted: Orders

## 2024-04-22 NOTE — Telephone Encounter (Signed)
 Pt called again stating that she is still in pain and is requesting pain meds. Please advise

## 2024-04-25 ENCOUNTER — Telehealth: Payer: Self-pay

## 2024-04-25 NOTE — Telephone Encounter (Signed)
 Patient called in stating she was at [facility] trying to give them the rest of her pills(montorfano wrote her rx for too many pills. ) they state they do not to that. Spoke with Montorfano he stated to let her know just to throw the rest away. She understood.

## 2024-04-30 ENCOUNTER — Ambulatory Visit

## 2024-04-30 VITALS — BP 119/78 | HR 79

## 2024-04-30 DIAGNOSIS — Z09 Encounter for follow-up examination after completed treatment for conditions other than malignant neoplasm: Secondary | ICD-10-CM

## 2024-04-30 DIAGNOSIS — H02824 Cysts of left upper eyelid: Secondary | ICD-10-CM

## 2024-04-30 DIAGNOSIS — Z9889 Other specified postprocedural states: Secondary | ICD-10-CM

## 2024-04-30 NOTE — Progress Notes (Signed)
" ° °  Established Patient Office Visit  Subjective   Patient ID: Melinda Christian, female    DOB: [birth year]  Age: 49 y.o. MRN: [id number]  Chief Complaint  Patient presents with   post op    HPI Patient is status post excision of upper eyelid cyst.  Pathology showed a benign epidermal inclusion cyst.  This was discussed with the patient.  Patient presents for follow-up and for removal of stitches from left eyelid. No issues reported.   Objective:     BP 119/78 (BP Location: Left Arm, Patient Position: Sitting, Cuff Size: Normal)   Pulse 79   SpO2 100%  BP Readings from Last 3 Encounters:  04/30/24 119/78  04/21/24 124/71  03/27/24 117/75      Physical Exam MA as chaperone Incision clean dry intact.  With stitches in place.  No signs of infection.  No seroma.    Assessment & Plan:   Problem List Items Addressed This Visit   None Visit Diagnoses       Follow-up exam    -  Primary     Cyst of left upper eyelid           Patient is doing well after surgery.  Pathology discussed with patient.  Stitches removed during this consultation.  Patient tolerated well.  We will start scar massage and scar creams in approximately 2 weeks.  Patient will follow-up with me in February.  All questions answered to patient satisfaction.   Melinda Deese M Weslee Prestage, MD  "

## 2024-05-14 ENCOUNTER — Ambulatory Visit

## 2024-05-14 VITALS — BP 115/73 | HR 85 | Ht 71.0 in | Wt 168.0 lb

## 2024-05-14 DIAGNOSIS — H02826 Cysts of left eye, unspecified eyelid: Secondary | ICD-10-CM

## 2024-05-14 DIAGNOSIS — Z09 Encounter for follow-up examination after completed treatment for conditions other than malignant neoplasm: Secondary | ICD-10-CM

## 2024-05-14 NOTE — Progress Notes (Signed)
" ° °  Established Patient Office Visit  Subjective   Patient ID: Melinda Christian, female    DOB: [birth year]  Age: 49 y.o. MRN: [id number]  Chief Complaint  Patient presents with   Post-op Follow-up    HPI  23 F status post excision of upper eyelid cyst. Pathology showed a benign epidermal inclusion cyst. Patient presents for follow-up. Reports no issues.     Objective:     BP 115/73 (BP Location: Left Arm, Patient Position: Sitting, Cuff Size: Normal)   Pulse 85   Ht 5' 11 (1.803 m)   Wt 168 lb (76.2 kg)   SpO2 98%   BMI 23.43 kg/m  BP Readings from Last 3 Encounters:  05/14/24 115/73  04/30/24 119/78  04/21/24 124/71    Physical Exam MA as chaperone Incision clean dry intact.  With stitches in place.  No signs of infection.  No seroma.  The ASCVD Risk score (Arnett DK, et al., 2019) failed to calculate for the following reasons:   Cannot find a previous HDL lab   Cannot find a previous total cholesterol lab   * - Cholesterol units were assumed    Assessment & Plan:   Problem List Items Addressed This Visit   None Visit Diagnoses       Follow-up exam    -  Primary     Eyelid cyst, left           Patient is doing well after surgery. Recommended start scar massage and silicone treatments starting today. Patient will follow-up with me in 3 months. All questions answered to patient satisfaction.   Jalyssa Fleisher M Maston Wight, MD  "

## 2024-08-13 ENCOUNTER — Ambulatory Visit
